# Patient Record
Sex: Male | Born: 1950 | Race: White | Hispanic: No | Marital: Single | Smoking: Current every day smoker
Health system: Southern US, Community
[De-identification: ages and names within clinical notes are randomized; demographics above are authoritative.]

## PROBLEM LIST (undated history)

## (undated) DIAGNOSIS — E785 Hyperlipidemia, unspecified: Secondary | ICD-10-CM

## (undated) DIAGNOSIS — Z5189 Encounter for other specified aftercare: Secondary | ICD-10-CM

## (undated) DIAGNOSIS — M25512 Pain in left shoulder: Principal | ICD-10-CM

## (undated) DIAGNOSIS — G8929 Other chronic pain: Secondary | ICD-10-CM

## (undated) DIAGNOSIS — F419 Anxiety disorder, unspecified: Secondary | ICD-10-CM

## (undated) DIAGNOSIS — I4510 Unspecified right bundle-branch block: Secondary | ICD-10-CM

## (undated) DIAGNOSIS — R0789 Other chest pain: Secondary | ICD-10-CM

## (undated) DIAGNOSIS — I10 Essential (primary) hypertension: Secondary | ICD-10-CM

## (undated) DIAGNOSIS — B1920 Unspecified viral hepatitis C without hepatic coma: Secondary | ICD-10-CM

## (undated) HISTORY — DX: Other chronic pain: G89.29

## (undated) HISTORY — PX: SPLENECTOMY: SUR1306

## (undated) HISTORY — PX: CHOLECYSTECTOMY: SHX55

## (undated) HISTORY — DX: Hyperlipidemia, unspecified: E78.5

## (undated) HISTORY — PX: HERNIA REPAIR: SHX51

## (undated) HISTORY — DX: Pain in left shoulder: M25.512

## (undated) HISTORY — PX: FRACTURE SURGERY: SHX138

## (undated) HISTORY — DX: Unspecified viral hepatitis C without hepatic coma: B19.20

## (undated) HISTORY — DX: Anxiety disorder, unspecified: F41.9

## (undated) HISTORY — DX: Encounter for other specified aftercare: Z51.89

---

## 1999-08-10 ENCOUNTER — Ambulatory Visit (HOSPITAL_BASED_OUTPATIENT_CLINIC_OR_DEPARTMENT_OTHER): Admission: RE | Admit: 1999-08-10 | Discharge: 1999-08-11 | Payer: Self-pay | Admitting: *Deleted

## 2008-07-05 ENCOUNTER — Emergency Department (HOSPITAL_COMMUNITY): Admission: EM | Admit: 2008-07-05 | Discharge: 2008-07-05 | Payer: Self-pay | Admitting: Emergency Medicine

## 2008-12-20 ENCOUNTER — Emergency Department (HOSPITAL_COMMUNITY): Admission: EM | Admit: 2008-12-20 | Discharge: 2008-12-20 | Payer: Self-pay | Admitting: Emergency Medicine

## 2009-09-18 ENCOUNTER — Ambulatory Visit (HOSPITAL_COMMUNITY): Admission: RE | Admit: 2009-09-18 | Discharge: 2009-09-18 | Payer: Self-pay | Admitting: Family Medicine

## 2009-09-25 ENCOUNTER — Encounter (INDEPENDENT_AMBULATORY_CARE_PROVIDER_SITE_OTHER): Payer: Self-pay | Admitting: Surgery

## 2009-09-25 ENCOUNTER — Inpatient Hospital Stay (HOSPITAL_COMMUNITY): Admission: EM | Admit: 2009-09-25 | Discharge: 2009-09-26 | Payer: Self-pay | Admitting: Emergency Medicine

## 2010-11-04 LAB — LIPASE, BLOOD: Lipase: 21 U/L (ref 11–59)

## 2010-11-04 LAB — DIFFERENTIAL
Basophils Relative: 1 % (ref 0–1)
Lymphocytes Relative: 21 % (ref 12–46)
Lymphs Abs: 3.4 10*3/uL (ref 0.7–4.0)
Monocytes Absolute: 1.1 10*3/uL — ABNORMAL HIGH (ref 0.1–1.0)
Monocytes Relative: 7 % (ref 3–12)
Neutro Abs: 11 10*3/uL — ABNORMAL HIGH (ref 1.7–7.7)
Neutrophils Relative %: 68 % (ref 43–77)

## 2010-11-04 LAB — HEPATIC FUNCTION PANEL
ALT: 92 U/L — ABNORMAL HIGH (ref 0–53)
AST: 131 U/L — ABNORMAL HIGH (ref 0–37)
Alkaline Phosphatase: 85 U/L (ref 39–117)
Indirect Bilirubin: 0.5 mg/dL (ref 0.3–0.9)
Total Bilirubin: 0.8 mg/dL (ref 0.3–1.2)

## 2010-11-04 LAB — CBC
MCHC: 34.4 g/dL (ref 30.0–36.0)
RBC: 4.82 MIL/uL (ref 4.22–5.81)

## 2010-11-04 LAB — POCT I-STAT, CHEM 8
HCT: 48 % (ref 39.0–52.0)
Hemoglobin: 16.3 g/dL (ref 13.0–17.0)
Sodium: 138 mEq/L (ref 135–145)

## 2010-11-22 LAB — DIFFERENTIAL
Basophils Absolute: 0.1 K/uL (ref 0.0–0.1)
Basophils Relative: 1 % (ref 0–1)
Eosinophils Absolute: 0.4 10*3/uL (ref 0.0–0.7)
Eosinophils Relative: 2 % (ref 0–5)
Lymphocytes Relative: 22 % (ref 12–46)
Lymphs Abs: 3.8 K/uL (ref 0.7–4.0)
Monocytes Absolute: 1.2 10*3/uL — ABNORMAL HIGH (ref 0.1–1.0)
Monocytes Relative: 7 % (ref 3–12)
Neutro Abs: 12 K/uL — ABNORMAL HIGH (ref 1.7–7.7)
Neutrophils Relative %: 69 % (ref 43–77)

## 2010-11-22 LAB — URINALYSIS, ROUTINE W REFLEX MICROSCOPIC
Bilirubin Urine: NEGATIVE
Glucose, UA: NEGATIVE mg/dL
Hgb urine dipstick: NEGATIVE
Ketones, ur: NEGATIVE mg/dL
Nitrite: NEGATIVE
Protein, ur: NEGATIVE mg/dL
Specific Gravity, Urine: 1.021 (ref 1.005–1.030)
Urobilinogen, UA: 1 mg/dL (ref 0.0–1.0)
pH: 6 (ref 5.0–8.0)

## 2010-11-22 LAB — COMPREHENSIVE METABOLIC PANEL WITH GFR
AST: 84 U/L — ABNORMAL HIGH (ref 0–37)
Alkaline Phosphatase: 97 U/L (ref 39–117)
GFR calc Af Amer: >60 mL/min (ref >60)
GFR calc non Af Amer: >60 mL/min (ref >60)
Potassium: 3.5 meq/L (ref 3.5–5.1)
Sodium: 140 meq/L (ref 135–145)
Total Bilirubin: 0.6 mg/dL (ref 0.3–1.2)
Total Protein: 5.9 g/dL — ABNORMAL LOW (ref 6.0–8.3)

## 2010-11-22 LAB — COMPREHENSIVE METABOLIC PANEL
ALT: 58 U/L — ABNORMAL HIGH (ref 0–53)
Albumin: 3.6 g/dL (ref 3.5–5.2)
BUN: 15 mg/dL (ref 6–23)
CO2: 30 mEq/L (ref 19–32)
Calcium: 9 mg/dL (ref 8.4–10.5)
Chloride: 105 mEq/L (ref 96–112)
Creatinine, Ser: 0.8 mg/dL (ref 0.4–1.5)
Glucose, Bld: 122 mg/dL — ABNORMAL HIGH (ref 70–99)

## 2010-11-22 LAB — CBC
HCT: 43.3 % (ref 39.0–52.0)
Hemoglobin: 15.2 g/dL (ref 13.0–17.0)
MCHC: 35 g/dL (ref 30.0–36.0)
MCV: 98.6 fL (ref 78.0–100.0)
Platelets: 305 K/uL (ref 150–400)
RBC: 4.39 MIL/uL (ref 4.22–5.81)
RDW: 13.7 % (ref 11.5–15.5)
WBC: 17.5 10*3/uL — ABNORMAL HIGH (ref 4.0–10.5)

## 2010-11-22 LAB — ETHANOL: Alcohol, Ethyl (B): <5 mg/dL (ref 0–10)

## 2010-11-22 LAB — LIPASE, BLOOD: Lipase: 25 U/L (ref 11–59)

## 2011-10-19 ENCOUNTER — Ambulatory Visit (INDEPENDENT_AMBULATORY_CARE_PROVIDER_SITE_OTHER): Payer: 59

## 2011-10-21 ENCOUNTER — Ambulatory Visit (INDEPENDENT_AMBULATORY_CARE_PROVIDER_SITE_OTHER): Payer: 59 | Admitting: Family Medicine

## 2011-10-21 VITALS — BP 112/75 | HR 91 | Temp 97.9°F | Resp 16 | Ht 75.0 in | Wt 210.0 lb

## 2011-10-21 DIAGNOSIS — F411 Generalized anxiety disorder: Secondary | ICD-10-CM

## 2011-10-21 DIAGNOSIS — M25519 Pain in unspecified shoulder: Secondary | ICD-10-CM

## 2011-10-21 DIAGNOSIS — G4700 Insomnia, unspecified: Secondary | ICD-10-CM

## 2011-10-21 DIAGNOSIS — F419 Anxiety disorder, unspecified: Secondary | ICD-10-CM

## 2011-10-21 MED ORDER — AMITRIPTYLINE HCL 100 MG PO TABS: 100.0000 mg | ORAL_TABLET | Freq: Every day | ORAL | Status: DC

## 2011-10-21 MED ORDER — HYDROCODONE-IBUPROFEN 5-200 MG PO TABS: 1.0000 | ORAL_TABLET | Freq: Every day | ORAL | Status: AC

## 2011-10-21 NOTE — Progress Notes (Signed)
  Subjective:    Patient ID: Tanner Webb, male    DOB: [birth year], 61 y.o.   MRN: [id number]  HPI 61 yo male with insomnia, anxiety, and chronic shoulder pain.  Takes amitriptyline, xanax, and vicoprofen for these issues.  Last given 6 month supply in September.  Now here for refill. 1) insomnia - amitriptyline 100mg . Helps with sleep and move.    2) anxiety - occ xanax.  Uses maybe only 2 times a month.  Still has refills left on previous Rx.   3) chronic shoulder pain - sometimes keeps him up at night.  Takes occ vicoprofen.  Worse after lifting.  Doesn't take daily.  A couple times a week when work is harder.     Review of Systems Negative except as per HPI     Objective:   Physical Exam  Constitutional: He appears well-developed and well-nourished.  Cardiovascular: Normal rate, regular rhythm, normal heart sounds and intact distal pulses.   No murmur heard. Pulmonary/Chest: Effort normal and breath sounds normal.  Neurological: He is alert.  Skin: Skin is warm and dry.          Assessment & Plan:  Insomnia - refilled amitriptyline.  Anxiety - has xanax refills.  Use until pharmacy says expired or until run out then call. Ok to refill.    Chronic shoulder pain - refilled vicoprofen.

## 2011-11-16 ENCOUNTER — Other Ambulatory Visit: Payer: Self-pay | Admitting: Family Medicine

## 2011-12-05 ENCOUNTER — Other Ambulatory Visit: Payer: Self-pay

## 2011-12-05 MED ORDER — ALPRAZOLAM 0.25 MG PO TABS: 0.2500 mg | ORAL_TABLET | Freq: Every evening | ORAL | Status: DC | PRN

## 2011-12-08 ENCOUNTER — Telehealth: Payer: Self-pay | Admitting: Internal Medicine

## 2011-12-08 MED ORDER — ALPRAZOLAM 0.25 MG PO TABS: 0.2500 mg | ORAL_TABLET | Freq: Every evening | ORAL | Status: DC | PRN

## 2011-12-08 NOTE — Telephone Encounter (Signed)
Refilled Xanax 0.25 mg #30 no refills.

## 2012-03-19 ENCOUNTER — Encounter: Payer: Self-pay | Admitting: Emergency Medicine

## 2012-03-21 ENCOUNTER — Encounter: Payer: Self-pay | Admitting: Emergency Medicine

## 2012-03-30 ENCOUNTER — Ambulatory Visit (INDEPENDENT_AMBULATORY_CARE_PROVIDER_SITE_OTHER): Payer: 59 | Admitting: Internal Medicine

## 2012-03-30 VITALS — BP 110/70 | HR 78 | Temp 98.3°F | Resp 17 | Ht 75.0 in | Wt 195.0 lb

## 2012-03-30 DIAGNOSIS — D649 Anemia, unspecified: Secondary | ICD-10-CM

## 2012-03-30 DIAGNOSIS — R634 Abnormal weight loss: Secondary | ICD-10-CM

## 2012-03-30 DIAGNOSIS — B1920 Unspecified viral hepatitis C without hepatic coma: Secondary | ICD-10-CM

## 2012-03-30 DIAGNOSIS — G8929 Other chronic pain: Secondary | ICD-10-CM

## 2012-03-30 DIAGNOSIS — Z23 Encounter for immunization: Secondary | ICD-10-CM

## 2012-03-30 DIAGNOSIS — M25519 Pain in unspecified shoulder: Secondary | ICD-10-CM

## 2012-03-30 DIAGNOSIS — IMO0001 Reserved for inherently not codable concepts without codable children: Secondary | ICD-10-CM

## 2012-03-30 DIAGNOSIS — R350 Frequency of micturition: Secondary | ICD-10-CM

## 2012-03-30 DIAGNOSIS — E785 Hyperlipidemia, unspecified: Secondary | ICD-10-CM

## 2012-03-30 LAB — LIPID PANEL
Cholesterol: 205 mg/dL — ABNORMAL HIGH (ref 0–200)
LDL Cholesterol: 105 mg/dL — ABNORMAL HIGH (ref 0–99)
Total CHOL/HDL Ratio: 2.8 Ratio
Triglycerides: 135 mg/dL (ref <150)
VLDL: 27 mg/dL (ref 0–40)

## 2012-03-30 LAB — POCT CBC
HCT, POC: 35.4 % — AB (ref 43.5–53.7)
Lymph, poc: 2 (ref 0.6–3.4)
MID (cbc): 0.3 (ref 0–0.9)
MPV: 9.4 fL (ref 0–99.8)
POC LYMPH PERCENT: 55.3 %L — AB (ref 10–50)
POC MID %: 8.1 %M (ref 0–12)
Platelet Count, POC: 160 10*3/uL (ref 142–424)

## 2012-03-30 LAB — COMPREHENSIVE METABOLIC PANEL
AST: 20 U/L (ref 0–37)
Alkaline Phosphatase: 47 U/L (ref 39–117)
BUN: 12 mg/dL (ref 6–23)
CO2: 26 mEq/L (ref 19–32)
Chloride: 103 mEq/L (ref 96–112)
Creat: 0.8 mg/dL (ref 0.50–1.35)
Glucose, Bld: 94 mg/dL (ref 70–99)
Potassium: 4.5 mEq/L (ref 3.5–5.3)

## 2012-03-30 LAB — POCT URINALYSIS DIPSTICK
Glucose, UA: NEGATIVE
Protein, UA: NEGATIVE
Urobilinogen, UA: 0.2

## 2012-03-30 LAB — POCT UA - MICROSCOPIC ONLY
Crystals, Ur, HPF, POC: NEGATIVE
Epithelial cells, urine per micros: NEGATIVE
RBC, urine, microscopic: NEGATIVE
WBC, Ur, HPF, POC: NEGATIVE

## 2012-03-30 MED ORDER — AMITRIPTYLINE HCL 100 MG PO TABS: 100.0000 mg | ORAL_TABLET | Freq: Every day | ORAL | Status: DC

## 2012-03-30 MED ORDER — HYDROCODONE-IBUPROFEN 7.5-200 MG PO TABS: 1.0000 | ORAL_TABLET | Freq: Three times a day (TID) | ORAL | Status: DC | PRN

## 2012-03-30 NOTE — Progress Notes (Signed)
Subjective:    Patient ID: Tanner Webb, male    DOB: 1951-03-28, 61 y.o.   MRN: [id number]  HPI Hx of hepatitis c almost in remission with tx at [facility]. Hx of chronic left shoulder pain, uses vicoprofen prn. Not to use tylenol due to hep c. Hx splenectomy, needs menactra   Review of Systems Schedule  cpe    Objective:   Physical Exam  Constitutional: He is oriented to person, place, and time. He appears well-developed and well-nourished.  HENT:  Right Ear: External ear normal.  Left Ear: External ear normal.  Mouth/Throat: Oropharynx is clear and moist.  Eyes: EOM are normal. Pupils are equal, round, and reactive to light.  Neck: Normal range of motion. Neck supple. No thyromegaly present.  Cardiovascular: Normal rate, regular rhythm and normal heart sounds.   Pulmonary/Chest: Effort normal and breath sounds normal.  Abdominal: Soft. Bowel sounds are normal. He exhibits no mass. There is no tenderness.  Musculoskeletal: He exhibits tenderness.  Lymphadenopathy:    He has no cervical adenopathy.  Neurological: He is alert and oriented to person, place, and time.  Skin: Skin is warm and dry.  Psychiatric: He has a normal mood and affect.   Results for orders placed in visit on 03/30/12  POCT CBC      Component Value Range   WBC 3.6 (*) 4.6 - 10.2 K/uL   Lymph, poc 2.0  0.6 - 3.4   POC LYMPH PERCENT 55.3 (*) 10 - 50 %L   MID (cbc) 0.3  0 - 0.9   POC MID % 8.1  0 - 12 %M   POC Granulocyte 1.3 (*) 2 - 6.9   Granulocyte percent 36.6 (*) 37 - 80 %G   RBC 3.64 (*) 4.69 - 6.13 M/uL   Hemoglobin 10.4 (*) 14.1 - 18.1 g/dL   HCT, POC 01.0 (*) 27.2 - 53.7 %   MCV 97.3 (*) 80 - 97 fL   MCH, POC 28.6  27 - 31.2 pg   MCHC 29.4 (*) 31.8 - 35.4 g/dL   RDW, POC 53.6     Platelet Count, POC 160  142 - 424 K/uL   MPV 9.4  0 - 99.8 fL  POCT URINALYSIS DIPSTICK      Component Value Range   Color, UA yellow     Clarity, UA clear     Glucose, UA neg     Bilirubin, UA neg     Ketones, UA neg     Spec Grav, UA <=1.005     Blood, UA neg     pH, UA 7.0     Protein, UA neg     Urobilinogen, UA 0.2     Nitrite, UA neg     Leukocytes, UA Negative    POCT UA - MICROSCOPIC ONLY      Component Value Range   WBC, Ur, HPF, POC neg     RBC, urine, microscopic neg     Bacteria, U Microscopic neg     Mucus, UA neg     Epithelial cells, urine per micros neg     Crystals, Ur, HPF, POC neg     Casts, Ur, LPF, POC neg     Yeast, UA neg    POCT SEDIMENTATION RATE      Component Value Range   POCT SED RATE    0 - 22 mm/hr   Add hemosure       Assessment & Plan:  New anemia, 2011 h/h  was normal, is on immunosuppresive therapy. Was told tx cause. RF meds 1 yr CPE 9/5 730am 102

## 2012-03-31 LAB — IBC PANEL
TIBC: 397 ug/dL (ref 215–435)
UIBC: 273 ug/dL (ref 125–400)

## 2012-03-31 LAB — IRON: Iron: 124 ug/dL (ref 42–165)

## 2012-04-01 ENCOUNTER — Encounter: Payer: Self-pay | Admitting: Radiology

## 2012-04-18 ENCOUNTER — Ambulatory Visit (INDEPENDENT_AMBULATORY_CARE_PROVIDER_SITE_OTHER): Payer: 59 | Admitting: Internal Medicine

## 2012-04-18 ENCOUNTER — Ambulatory Visit: Payer: 59

## 2012-04-18 VITALS — BP 108/68 | HR 72 | Temp 97.6°F | Resp 16 | Ht 75.0 in | Wt 196.4 lb

## 2012-04-18 DIAGNOSIS — G4700 Insomnia, unspecified: Secondary | ICD-10-CM

## 2012-04-18 DIAGNOSIS — D6489 Other specified anemias: Secondary | ICD-10-CM

## 2012-04-18 DIAGNOSIS — B182 Chronic viral hepatitis C: Secondary | ICD-10-CM

## 2012-04-18 DIAGNOSIS — Z0000 Encounter for general adult medical examination without abnormal findings: Secondary | ICD-10-CM

## 2012-04-18 NOTE — Progress Notes (Signed)
  Subjective:    Patient ID: Tanner Webb, male    DOB: [birth year], 61 y.o.   MRN: [id number]  HPI Hep C txed at [facility], not in remission but improved. 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 Insomnia and chronic pain shoulder controlled. Works hard labor regularly. See scanned hx All labs are good here  Review of Systems See scanned ros    Objective:   Physical Exam  Nursing note and vitals reviewed. Constitutional: He is oriented to person, place, and time. He appears well-developed and well-nourished. No distress.  HENT:  Head: Normocephalic.  Right Ear: External ear normal.  Left Ear: External ear normal.  Nose: Nose normal.  Mouth/Throat: Oropharynx is clear and moist.  Eyes: Conjunctivae and EOM are normal. Pupils are equal, round, and reactive to light.  Neck: Normal range of motion. Neck supple. No thyromegaly present.  Cardiovascular: Normal rate, regular rhythm and normal heart sounds.   Pulmonary/Chest: Effort normal and breath sounds normal.  Abdominal: Soft. Bowel sounds are normal.  Genitourinary: Rectum normal, prostate normal and penis normal.  Musculoskeletal: Normal range of motion. He exhibits tenderness.  Lymphadenopathy:    He has no cervical adenopathy.  Neurological: He is alert and oriented to person, place, and time. He has normal reflexes. No cranial nerve deficit. He exhibits normal muscle tone. Coordination normal.  Skin: Skin is warm and dry.  Psychiatric: He has a normal mood and affect. His behavior is normal. Judgment and thought content normal.     [facility] reading (PRIMARY) by  Dr.Amos Micheals cxr.nad  EKG ok     Assessment & Plan:  Refer for colonoscopy his first RF all meds 1 yr

## 2012-04-18 NOTE — Patient Instructions (Addendum)
Colorectal Cancer The colon is the large bowel and is the storage part of the bowel for undigested food. The large bowel is also called the intestine or gut. Cancer is a growth that is not supposed to be there.  RISK FACTORS Risks for cancer of the colon include:   Age. More than 90 percent of people with this disease are diagnosed after age 59.   Polyps. Growths on the inner wall of the colon or rectum may become cancerous.   Family history. Some cancers of the colon are inherited or run in families. These include:   Hereditary nonpolyposis colon cancer (HNPCC) is the most common type of inherited (genetic) colorectal cancer. It accounts for about 2 percent of all colorectal cancer cases. It is caused by genetic changes. About 75% of people with an altered HNPCC gene develop colon cancer, and the average age at diagnosis of colon cancer is 44.   Familial adenomatous polyposis (FAP) is a rare inherited condition in which hundreds of polyps form in the colon and rectum. It is caused by a change in a specific gene called APC. Unless FAP is treated, it usually leads to colorectal cancer by age 40. FAP accounts for less than 1 percent of all colorectal cancer cases.   Family members of people who have HNPCC or FAP can have genetic testing to check for specific genetic changes. For those who have changes in their genes, health care providers may suggest ways to try to reduce the risk of colorectal cancer or to improve the detection of this disease. For adults with FAP, the doctor may recommend an operation to remove all or part of the colon and rectum.   Personal history of colorectal cancer. A person who has already had colorectal cancer may develop colorectal cancer a second time. Also, women with a history of cancer of the ovary, uterus (endometrium), or breast are at a somewhat higher risk of developing colorectal cancer.   Ulcerative colitis or Crohn's disease. A person who has had a condition  that causes inflammation of the colon (such as ulcerative colitis or Crohn's disease) for many years is at increased risk of developing colorectal cancer.   Diet. Studies suggest that diets high in fat (especially animal fat) and low in calcium, folate, and fiber may increase the risk of colorectal cancer. Also, some studies suggest that people who eat a diet very low in fruits and vegetables may have a higher risk of colorectal cancer. More research is needed to better understand how diet affects the risk of colorectal cancer.   Cigarette smoking. A person who smokes cigarettes may be at increased risk of developing polyps and colorectal cancer.  SYMPTOMS  Changes in bowel habits.   Diarrhea, constipation, or feeling that the bowel does not empty completely.   Blood (either bright red or very dark) in the stool.   Stools that are narrower than usual.   General discomfort in your belly (abdomen): frequent gas pains, bloating, fullness, and/or cramps.   Weight loss with no known reason.   Constant tiredness.   Nausea and vomiting.  Other health problems can cause the same symptoms, and often these symptoms are not due to cancer. Anyone with these symptoms should see a doctor so that any problem can be diagnosed and treated as early as possible. Usually, early cancer does not cause pain. It is important not to wait to feel pain before seeing a doctor. DIAGNOSIS  If you have any signs or symptoms of   colorectal cancer, the doctor must determine whether they are due to cancer or some other cause. The doctor will ask about personal and family medical history and may do a physical exam. You may have one or more tests to screen for cancer. If the physical exam and test results do not suggest cancer, the doctor may decide that no further tests are needed and no treatment is necessary. Your caregiver may recommend a schedule for checkups. If X-rays show an abnormal area (such as a polyp), a piece of  tissue is taken (biopsy) to check for cancer cells. Often, the abnormal tissue can be removed during a test that examines the colon. These tests include:  Sigmoidoscopy. With this test, the caregiver can see inside your large intestine. A thin flexible tube is placed into your rectum. The device is called a sigmoidoscope. This device has a light and a tiny video camera in it. The caregiver uses the sigmoidoscope to look at the last third of your large intestine.   Colonoscopy. This test is like sigmoidoscopy, but the caregiver looks at all of the large intestine. It usually requires sedation.  If a biopsy was taken, a specialist in examining tissues (pathologist) checks the tissue for cancer cells using a microscope.  If the biopsy shows that cancer is present, the doctor needs to know the extent (stage) of the disease to plan the best treatment. The stage is based on whether the tumor has invaded nearby tissues, whether the cancer has spread, and if so, to what parts of the body. Staging may involve some of the following tests and procedures:  Blood tests. The doctor checks for carcinoembryonic antigen (CEA) and other substances in the blood. Some people who have colorectal cancer have a high CEA level.   Sigmoidoscopy.   Colonoscopy.   Endorectal ultrasound. An ultrasound probe is inserted into the rectum. The probe sends out sound waves that people cannot hear. The waves bounce off the rectum and nearby tissues, and a computer uses the echoes to create a picture. The picture shows how deep a rectal tumor has grown or whether the cancer has spread to lymph nodes or other nearby tissues.   Chest X-ray. X-rays of the chest can show whether cancer has spread to the lungs.   CT scan. This is a X-ray machine linked to a computer takes pictures of areas inside the body. The patient may receive an injection of dye. Tumors in the liver, lungs, or elsewhere in the body show up on the CT scan.  The doctor  also may use other tests (such as MRI) to see whether the cancer has spread. Sometimes staging is not complete until the patient has surgery to remove the tumor. (Surgery for colorectal cancer is described in the "Treatment" section.) DOCTORS DESCRIBE COLORECTAL CANCER BY THE FOLLOWING STAGES:   Stage 0. The cancer is found only in the innermost lining of the colon or rectum. Another name for Stage 0 colorectal cancer is Carcinoma in situ.   Stage I. The cancer has grown into the inner wall of the colon or rectum. The tumor has not reached the outer wall of the colon or extended outside the colon. Another name for Stage I colorectal cancer is Dukes' A.   Stage II. The tumor extends more deeply into or through the wall of the colon or rectum. It may have invaded nearby tissue, but cancer cells have not spread to the lymph nodes. Another name for Stage II colorectal cancer is   Dukes' B.   Stage III. The cancer has spread to nearby lymph nodes but not to other parts of the body. Another name for Stage III colorectal cancer is Dukes' C.   Stage IV. The cancer has spread to other parts of the body, such as the liver or lungs. Another name for Stage IV colorectal cancer is Dukes' D.   Recurrent cancer. This is cancer that has been treated and has returned after a period of time when the cancer could not be detected. The disease may return in the colon, rectum or in another part of the body.  Once you know the diagnosis and the stage of cancer of the colon, your caregiver can advise you and help you decide what the best course of treatment will be.  Document Released: 07/31/2005 Document Revised: 07/20/2011 Document Reviewed: 07/18/2011 [employer] Patient Information 2012 [employer]. 

## 2012-05-09 ENCOUNTER — Ambulatory Visit (INDEPENDENT_AMBULATORY_CARE_PROVIDER_SITE_OTHER): Payer: 59 | Admitting: Family Medicine

## 2012-05-09 ENCOUNTER — Ambulatory Visit: Payer: 59

## 2012-05-09 VITALS — BP 125/81 | HR 84 | Temp 98.3°F | Resp 18 | Ht 74.5 in | Wt 199.0 lb

## 2012-05-09 DIAGNOSIS — T148XXA Other injury of unspecified body region, initial encounter: Secondary | ICD-10-CM

## 2012-05-09 DIAGNOSIS — R0781 Pleurodynia: Secondary | ICD-10-CM

## 2012-05-09 DIAGNOSIS — R079 Chest pain, unspecified: Secondary | ICD-10-CM

## 2012-05-09 DIAGNOSIS — R071 Chest pain on breathing: Secondary | ICD-10-CM
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
## 2012-05-15 DIAGNOSIS — Z23 Encounter for immunization: Secondary | ICD-10-CM

## 2012-05-23 ENCOUNTER — Telehealth: Payer: Self-pay

## 2012-05-23 ENCOUNTER — Ambulatory Visit: Payer: 59

## 2012-05-23 NOTE — Telephone Encounter (Signed)
No fx no pneumothorax, called patient to advise. He did better for a while but now pain has returned and he is getting worse. I have advised for him to come back in, to recheck, he is coming in today.

## 2012-05-23 NOTE — Telephone Encounter (Signed)
Pt states he was told we would send rib xrays to be read and call him,it has been two weeks and he hasn't received call   Best phone [phone]

## 2012-09-28 ENCOUNTER — Other Ambulatory Visit: Payer: Self-pay | Admitting: Internal Medicine

## 2012-10-01 ENCOUNTER — Ambulatory Visit (INDEPENDENT_AMBULATORY_CARE_PROVIDER_SITE_OTHER): Payer: 59 | Admitting: Emergency Medicine

## 2012-10-01 VITALS — BP 133/85 | HR 80 | Temp 97.6°F | Resp 16 | Ht 75.0 in | Wt 209.0 lb

## 2012-10-01 DIAGNOSIS — T1500XA Foreign body in cornea, unspecified eye, initial encounter: Secondary | ICD-10-CM

## 2012-10-01 DIAGNOSIS — G8929 Other chronic pain: Secondary | ICD-10-CM

## 2012-10-01 DIAGNOSIS — M25519 Pain in unspecified shoulder: Secondary | ICD-10-CM

## 2012-10-01 MED ORDER — AMITRIPTYLINE HCL 100 MG PO TABS: 100.0000 mg | ORAL_TABLET | Freq: Every day | ORAL | Status: DC

## 2012-10-01 NOTE — Progress Notes (Signed)
[facility] [address] [place] [address] [phone]  Date:  10/01/2012   Name:  Tanner Webb   DOB:  [birth year]   MRN:  [id number]  PCP:  Tally Due, MD    Chief Complaint: Eye Pain   History of Present Illness:  Tanner Webb is a 62 y.o. very pleasant male patient who presents with the following:  Was cleaning the convertible top of a car on Sunday and splashed a cleaner in his eye.  Has persistent pain in the right eye.  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  Persistent foreign body sensation.  No other complaints. Requested refills on amitriptyline and vicoprofen for his rotator cuff injuryt Patient Active Problem List  Diagnosis  . Chronic pain in left shoulder  . Anemia    Past Medical History  Diagnosis Date  . Chronic pain in left shoulder   . Hyperlipidemia     Past Surgical History  Procedure Laterality Date  . Cholecystectomy      History  Substance Use Topics  . Smoking status: Former Smoker    Quit date: 02/28/2012  . Smokeless tobacco: Not on file  . Alcohol Use: Not on file    History reviewed. No pertinent family history.  Allergies  Allergen Reactions  . Penicillins   . Sulfa Antibiotics     Medication list has been reviewed and updated.  Current Outpatient Prescriptions on File Prior to Visit  Medication Sig Dispense Refill  . amitriptyline (ELAVIL) 100 MG tablet Take 1 tablet (100 mg total) by mouth at bedtime.  30 tablet  5  . HYDROcodone-ibuprofen (VICOPROFEN) 7.5-200 MG per tablet Take 1 tablet by mouth every 8 (eight) hours as needed.  90 tablet  1  . ALPRAZolam (XANAX) 0.25 MG tablet Take 1 tablet (0.25 mg total) by mouth at bedtime as needed.  30 tablet  0   No current facility-administered medications on file prior to visit.    Review of Systems:  As per HPI, otherwise negative.    Physical Examination: Filed Vitals:   10/01/12 0850  BP: 133/85  Pulse: 80  Temp:  97.6 F (36.4 C)  Resp: 16   Filed Vitals:   10/01/12 0850  Height: 6\' 3"  (1.905 m)  Weight: 209 lb (94.802 kg)   Body mass index is 26.12 kg/(m^2). Ideal Body Weight: Weight in (lb) to have BMI = 25: 199.6   GEN: WDWN, NAD, Non-toxic, Alert & Oriented x 3 HEENT: Atraumatic, Normocephalic.   Possible embedded foreign body right cornea.  Has a metallic glint.  Marked injection. Ears and Nose: No external deformity. EXTR: No clubbing/cyanosis/edema NEURO: Normal gait.  PSYCH: Normally interactive. Conversant. Not depressed or anxious appearing.  Calm demeanor.    Assessment and Plan: Embedded foreign body eye Ofloxacin drops Follow up with ophthalmologist today.  Has his own.  Carmelina Dane, MD

## 2012-10-01 NOTE — Patient Instructions (Addendum)
[facility] Policy for Prescribing Controlled Substances (Revised 06/2012) 1. Prescriptions for controlled substances will be filled by ONE provider at [facility] with whom you have established and developed a plan for your care, including follow-up. 2. You are encouraged to schedule an appointment with your prescriber at our appointment center for follow-up visits whenever possible. 3. If you request a prescription for the controlled substance while at [facility] for an acute problem (with someone other than your regular prescriber), you MAY be given a ONE-TIME prescription for a 30-day supply of the controlled substance, to allow time for you to return to see your regular prescriber for additional prescriptions. 4.  

## 2012-10-02 ENCOUNTER — Other Ambulatory Visit: Payer: Self-pay | Admitting: Internal Medicine

## 2012-10-03 NOTE — Telephone Encounter (Signed)
Needs to RTC to discuss need of RFs on HC.

## 2012-10-06 ENCOUNTER — Ambulatory Visit (INDEPENDENT_AMBULATORY_CARE_PROVIDER_SITE_OTHER): Payer: 59 | Admitting: Internal Medicine

## 2012-10-06 VITALS — BP 130/80 | HR 76 | Temp 98.0°F | Resp 17 | Ht 75.0 in | Wt 208.0 lb

## 2012-10-06 DIAGNOSIS — R634 Abnormal weight loss: Secondary | ICD-10-CM

## 2012-10-06 DIAGNOSIS — B1920 Unspecified viral hepatitis C without hepatic coma: Secondary | ICD-10-CM

## 2012-10-06 DIAGNOSIS — M25519 Pain in unspecified shoulder: Secondary | ICD-10-CM

## 2012-10-06 DIAGNOSIS — IMO0001 Reserved for inherently not codable concepts without codable children: Secondary | ICD-10-CM

## 2012-10-06 DIAGNOSIS — E785 Hyperlipidemia, unspecified: Secondary | ICD-10-CM

## 2012-10-06 DIAGNOSIS — Z818 Family history of other mental and behavioral disorders: Secondary | ICD-10-CM

## 2012-10-06 DIAGNOSIS — D649 Anemia, unspecified: Secondary | ICD-10-CM

## 2012-10-06 DIAGNOSIS — G4700 Insomnia, unspecified: Secondary | ICD-10-CM

## 2012-10-06 DIAGNOSIS — G8929 Other chronic pain: Secondary | ICD-10-CM

## 2012-10-06 MED ORDER — HYDROCODONE-IBUPROFEN 7.5-200 MG PO TABS: 1.0000 | ORAL_TABLET | Freq: Three times a day (TID) | ORAL | Status: DC | PRN

## 2012-10-06 MED ORDER — ALPRAZOLAM 0.25 MG PO TABS: 0.2500 mg | ORAL_TABLET | Freq: Every evening | ORAL | Status: DC | PRN

## 2012-10-06 NOTE — Patient Instructions (Addendum)
Alzheimer's Disease Alzheimer's disease is a breaking down of the brain that sometimes happens with aging. It often affects memory, thinking, and speaking. It also affects how you get along with people and job performance. Often the changes come on slowly and are not very noticeable. The changes may be silent and hidden for a long time, even from family and friends. Mood and personality changes often cause the family to seek medical care. CAUSES  Alzheimer's disease is the leading cause of dementia. Dementia is a reduced ability of the working of the brain. Alzheimer's is one type of dementia and is caused by small changes in the brain. There are many causes of Alzheimer's disease. DIAGNOSIS  There are no specific tests for Alzheimer's disease, other than doing a biopsy(tissue sample) of the brain. This is not usually done. Physical examination and history often provide the best clues for a diagnosis. Often the diagnosis may be made over time, with more observation.  TREATMENT  There is no specific treatment for Alzheimer's disease. Your caregivers will discuss the particular problems you are dealing with or may deal with. They can help direct you to other caregivers who can help in the care of Alzheimer's patients.  Document Released: 05/10/2005 Document Revised: 10/23/2011 Document Reviewed: 12/31/2006 [facility] Patient Information 2013 [place], [place]. Alzheimer's Disease Caregiver Guide Alzheimer's disease is an illness that affects a person's brain. It causes a person to lose the ability to remember things and make good decisions. As the disease progresses, the person is unable to take care of himself or herself and needs more and more help to do simple tasks. Taking care of someone with Alzheimer's disease can be very challenging and overwhelming.  MEMORY LOSS AND CONFUSION Memory loss and confusion is mild in the beginning stages of the disease. Both of these problems become more severe as the  disease progresses. Eventually, the person will not recognize places or even close family members and friends.   Stay calm.  Respond with a short explanation. Long explanations can be overwhelming and confusing.  Avoid corrections that sound like scolding.  Try not to take it personally, even if the person forgets your name. BEHAVIOR CHANGES Behavior changes are part of the disease. The person may develop depression, anxiety, anger, hallucinations, or other behavior changes. These changes can come on suddenly and may be in response to pain, infection, changes in the environment (temperature, noise), overstimulation, or feeling lost or scared.   Try not to take behavior changes personally.  Remain calm and patient.  Do not argue or try to convince the person about a specific point. This will only make him or her more agitated.  Know that the behavior changes are part of the disease process and try to work through it. TIPS TO REDUCE FRUSTRATION  Schedule wisely by making appointments and doing daily tasks, like bathing and dressing, when the person is at his or her best.  Take your time. Simple tasks may take a lot longer, so be sure to allow for plenty of time.  Limit choices. Too many choices can be overwhelming and stressful for the person.  Involve the person in what you are doing.  Stick to a routine.  Avoid new or crowded situations, if possible.  Use simple words, short sentences, and a calm voice. Only give 1 direction at a time.  Buy clothes and shoes that are easy to put on and take off.  Let people help if they offer. HOME SAFETY Keeping the home  safe is very important to reduce the risk of falls and injuries.   Keep floors clear of clutter. Remove rugs, magazine racks, and floor lamps.  Keep hallways well lit.  Put a handrail and nonslip mat in the bathtub or shower.  Put childproof locks on cabinets with dangerous items, such as medicine, alcohol, guns, toxic  cleaning items, sharp tools or utensils, matches, or lighters.  Place locks on doors where the person cannot easily see or reach them. This helps ensure that the person cannot wander out of the house and get lost.  Be prepared for emergencies. Keep a list of emergency phone numbers and addresses in a convenient area. PLANS FOR THE FUTURE  Do not put off talking about finances.  Talk about money management. People with Alzheimer's disease have trouble managing their money as the disease gets worse.  Get help from professional advisors regarding financial and legal matters.  Do not put off talking about future care.  Choose a power of attorney. This is someone who can make decisions for the person with Alzheimer's disease when he or she is no longer able to do so.  Talk about driving and when it is the right time to stop. The person's doctor can help give advice on this matter.  Talk about the person's living situation. If he or she lives alone, you need to make sure he or she is safe. Some people need extra help at home, and others need more care at a nursing home or care center. SUPPORT GROUPS Joining a support group can be very helpful for caregivers of people with Alzheimer's disease. Some advantages to being part of a support group include:   Getting strategies to manage stress.  Sharing experiences with others.  Receiving emotional comfort and support.  Learning new caregiving skills as the disease progresses.  Knowing what community resources are available and taking advantage of them. SEEK MEDICAL CARE IF:  The person has a fever.  The person has a sudden change in behavior that does not improve with calming strategies.  The person is unable to manage in his or her current living situation.  The person threatens you or anyone else, including himself or herself.  You are no longer able to care for the person. Document Released: 04/11/2004 Document Revised: 01/30/2012  Document Reviewed: 09/06/2011 [facility] Patient Information 2013 [place], [place].

## 2012-10-06 NOTE — Progress Notes (Signed)
  Subjective:    Patient ID: Tanner Webb, male    DOB: June 19, 1951, 62 y.o.   MRN: [id number]  HPI Requests rfs vicoprofen and alprazolam, med hx researched and not using meds daily only prn. Has shoulfder pain, mother with alzheimers and he is the care giver. Works hard in Data processing manager, reviewed again shoulder stretches and exercises. See problem list. Chronic joint/shoulder pain Insomnia All recent labs reviewed.  Review of Systems Hep c active beginning to worsen again/sees [facility] hepatology Anemia    Objective:   Physical Exam Appears well Lungs clear/Heart normal/No icterus Shoulder both painful with mild decrease rom but good strength and no nmsv loss.       Assessment & Plan:  Shoulder exercises Counseled on alzheimers/mom Continue and start new TX for Hep C RF alprazolam/Vicoprofen and to use sparingly

## 2012-10-09 NOTE — Progress Notes (Signed)
Reviewed and agree.

## 2013-06-06 ENCOUNTER — Ambulatory Visit: Payer: 59

## 2013-06-06 ENCOUNTER — Ambulatory Visit (INDEPENDENT_AMBULATORY_CARE_PROVIDER_SITE_OTHER): Payer: 59 | Admitting: Internal Medicine

## 2013-06-06 VITALS — BP 128/82 | HR 79 | Temp 98.4°F | Resp 17 | Ht 74.5 in | Wt 217.0 lb

## 2013-06-06 DIAGNOSIS — G8929 Other chronic pain: Secondary | ICD-10-CM

## 2013-06-06 DIAGNOSIS — Z818 Family history of other mental and behavioral disorders: Secondary | ICD-10-CM

## 2013-06-06 DIAGNOSIS — B1920 Unspecified viral hepatitis C without hepatic coma: Secondary | ICD-10-CM

## 2013-06-06 DIAGNOSIS — G4700 Insomnia, unspecified: Secondary | ICD-10-CM

## 2013-06-06 DIAGNOSIS — M25512 Pain in left shoulder: Secondary | ICD-10-CM

## 2013-06-06 DIAGNOSIS — M25519 Pain in unspecified shoulder: Secondary | ICD-10-CM

## 2013-06-06 DIAGNOSIS — D649 Anemia, unspecified: Secondary | ICD-10-CM

## 2013-06-06 LAB — POCT CBC
Hemoglobin: 15.9 g/dL (ref 14.1–18.1)
MCH, POC: 32.9 pg — AB (ref 27–31.2)
MCV: 100.2 fL — AB (ref 80–97)
MPV: 8.7 fL (ref 0–99.8)
POC MID %: 7.4 %M (ref 0–12)
RBC: 4.83 M/uL (ref 4.69–6.13)
WBC: 8.5 10*3/uL (ref 4.6–10.2)

## 2013-06-06 MED ORDER — ALPRAZOLAM 0.25 MG PO TABS: 0.2500 mg | ORAL_TABLET | Freq: Every evening | ORAL | Status: DC | PRN

## 2013-06-06 MED ORDER — AMITRIPTYLINE HCL 100 MG PO TABS: 100.0000 mg | ORAL_TABLET | Freq: Every day | ORAL | Status: DC

## 2013-06-06 MED ORDER — HYDROCODONE-IBUPROFEN 7.5-200 MG PO TABS: 1.0000 | ORAL_TABLET | Freq: Three times a day (TID) | ORAL | Status: DC | PRN

## 2013-06-06 NOTE — Patient Instructions (Signed)
Rotator Cuff Injury The rotator cuff is the collective set of muscles and tendons that make up the stabilizing unit of your shoulder. This unit holds in the ball of the humerus (upper arm bone) in the socket of the scapula (shoulder blade). Injuries to this stabilizing unit most commonly come from sports or activities that cause the arm to be moved repeatedly over the head. Examples of this include throwing, weight lifting, swimming, racquet sports, or an injury such as falling on your arm. Chronic (longstanding) irritation of this unit can cause inflammation (soreness), bursitis, and eventual damage to the tendons to the point of rupture (tear). An acute (sudden) injury of the rotator cuff can result in a partial or complete tear. You may need surgery with complete tears. Small or partial rotator cuff tears may be treated conservatively with temporary immobilization, exercises and rest. Physical therapy may be needed. HOME CARE INSTRUCTIONS   Apply ice to the injury for 15-20 minutes 3-4 times per day for the first 2 days. Put the ice in a plastic bag and place a towel between the bag of ice and your skin.  If you have a shoulder immobilizer (sling and straps), do not remove it for as long as directed by your caregiver or until you see a caregiver for a follow-up examination. If you need to remove it, move your arm as little as possible.  You may want to sleep on several pillows or in a recliner at night to lessen swelling and pain.  Only take over-the-counter or prescription medicines for pain, discomfort, or fever as directed by your caregiver. Do simple hand squeezing exercises with a soft rubber ball to decrease hand swelling.Shoulder Exercises EXERCISES  RANGE OF MOTION (ROM) AND STRETCHING EXERCISES These exercises may help you when beginning to rehabilitate your injury. Your symptoms may resolve with or without further involvement from your physician, physical therapist or athletic trainer.  While completing these exercises, remember:   Restoring tissue flexibility helps normal motion to return to the joints. This allows healthier, less painful movement and activity.  An effective stretch should be held for at least 30 seconds.  A stretch should never be painful. You should only feel a gentle lengthening or release in the stretched tissue. ROM - Pendulum  Bend at the waist so that your right / left arm falls away from your body. Support yourself with your opposite hand on a solid surface, such as a table or a countertop.  Your right / left arm should be perpendicular to the ground. If it is not perpendicular, you need to lean over farther. Relax the muscles in your right / left arm and shoulder as much as possible.  Gently sway your hips and trunk so they move your right / left arm without any use of your right / left shoulder muscles.  Progress your movements so that your right / left arm moves side to side, then forward and backward, and finally, both clockwise and counterclockwise.  Complete __________ repetitions in each direction. Many people use this exercise to relieve discomfort in their shoulder as well as to gain range of motion. Repeat __________ times. Complete this exercise __________ times per day. STRETCH  Flexion, Standing  Stand with good posture. With an underhand grip on your right / left hand and an overhand grip on the opposite hand, grasp a broomstick or cane so that your hands are a little more than shoulder-width apart.  Keeping your right / left elbow straight and shoulder  muscles relaxed, push the stick with your opposite hand to raise your right / left arm in front of your body and then overhead. Raise your arm until you feel a stretch in your right / left shoulder, but before you have increased shoulder pain.  Try to avoid shrugging your right / left shoulder as your arm rises by keeping your shoulder blade tucked down and toward your mid-back spine.  Hold __________ seconds.  Slowly return to the starting position. Repeat __________ times. Complete this exercise __________ times per day. STRETCH - Internal Rotation  Place your right / left hand behind your back, palm-up.  Throw a towel or belt over your opposite shoulder. Grasp the towel/belt with your right / left hand.  While keeping an upright posture, gently pull up on the towel/belt until you feel a stretch in the front of your right / left shoulder.  Avoid shrugging your right / left shoulder as your arm rises by keeping your shoulder blade tucked down and toward your mid-back spine.  Hold __________. Release the stretch by lowering your opposite hand. Repeat __________ times. Complete this exercise __________ times per day. STRETCH - External Rotation and Abduction  Stagger your stance through a doorframe. It does not matter which foot is forward.  As instructed by your physician, physical therapist or athletic trainer, place your hands:  And forearms above your head and on the door frame.  And forearms at head-height and on the door frame.  At elbow-height and on the door frame.  Keeping your head and chest upright and your stomach muscles tight to prevent over-extending your low-back, slowly shift your weight onto your front foot until you feel a stretch across your chest and/or in the front of your shoulders.  Hold __________ seconds. Shift your weight to your back foot to release the stretch. Repeat __________ times. Complete this stretch __________ times per day.  STRENGTHENING EXERCISES  These exercises may help you when beginning to rehabilitate your injury. They may resolve your symptoms with or without further involvement from your physician, physical therapist or athletic trainer. While completing these exercises, remember:   Muscles can gain both the endurance and the strength needed for everyday activities through controlled exercises.  Complete these  exercises as instructed by your physician, physical therapist or athletic trainer. Progress the resistance and repetitions only as guided.  You may experience muscle soreness or fatigue, but the pain or discomfort you are trying to eliminate should never worsen during these exercises. If this pain does worsen, stop and make certain you are following the directions exactly. If the pain is still present after adjustments, discontinue the exercise until you can discuss the trouble with your clinician.  If advised by your physician, during your recovery, avoid activity or exercises which involve actions that place your right / left hand or elbow above your head or behind your back or head. These positions stress the tissues which are trying to heal. STRENGTH - Scapular Depression and Adduction  With good posture, sit on a firm chair. Supported your arms in front of you with pillows, arm rests or a table top. Have your elbows in line with the sides of your body.  Gently draw your shoulder blades down and toward your mid-back spine. Gradually increase the tension without tensing the muscles along the top of your shoulders and the back of your neck.  Hold for __________ seconds. Slowly release the tension and relax your muscles completely before completing the next repetition.  After you have practiced this exercise, remove the arm support and complete it in standing as well as sitting. Repeat __________ times. Complete this exercise __________ times per day.  STRENGTH - External Rotators  Secure a rubber exercise band/tubing to a fixed object so that it is at the same height as your right / left elbow when you are standing or sitting on a firm surface.  Stand or sit so that the secured exercise band/tubing is at your side that is not injured.  Bend your elbow 90 degrees. Place a folded towel or small pillow under your right / left arm so that your elbow is a few inches away from your side.  Keeping  the tension on the exercise band/tubing, pull it away from your body, as if pivoting on your elbow. Be sure to keep your body steady so that the movement is only coming from your shoulder rotating.  Hold __________ seconds. Release the tension in a controlled manner as you return to the starting position. Repeat __________ times. Complete this exercise __________ times per day.  STRENGTH - Supraspinatus  Stand or sit with good posture. Grasp a __________ weight or an exercise band/tubing so that your hand is "thumbs-up," like when you shake hands.  Slowly lift your right / left hand from your thigh into the air, traveling about 30 degrees from straight out at your side. Lift your hand to shoulder height or as far as you can without increasing any shoulder pain. Initially, many people do not lift their hands above shoulder height.  Avoid shrugging your right / left shoulder as your arm rises by keeping your shoulder blade tucked down and toward your mid-back spine.  Hold for __________ seconds. Control the descent of your hand as you slowly return to your starting position. Repeat __________ times. Complete this exercise __________ times per day.  STRENGTH - Shoulder Extensors  Secure a rubber exercise band/tubing so that it is at the height of your shoulders when you are either standing or sitting on a firm arm-less chair.  With a thumbs-up grip, grasp an end of the band/tubing in each hand. Straighten your elbows and lift your hands straight in front of you at shoulder height. Step back away from the secured end of band/tubing until it becomes tense.  Squeezing your shoulder blades together, pull your hands down to the sides of your thighs. Do not allow your hands to go behind you.  Hold for __________ seconds. Slowly ease the tension on the band/tubing as you reverse the directions and return to the starting position. Repeat __________ times. Complete this exercise __________ times per day.    STRENGTH - Scapular Retractors  Secure a rubber exercise band/tubing so that it is at the height of your shoulders when you are either standing or sitting on a firm arm-less chair.  With a palm-down grip, grasp an end of the band/tubing in each hand. Straighten your elbows and lift your hands straight in front of you at shoulder height. Step back away from the secured end of band/tubing until it becomes tense.  Squeezing your shoulder blades together, draw your elbows back as you bend them. Keep your upper arm lifted away from your body throughout the exercise.  Hold __________ seconds. Slowly ease the tension on the band/tubing as you reverse the directions and return to the starting position. Repeat __________ times. Complete this exercise __________ times per day. STRENGTH  Scapular Depressors  Find a sturdy chair without wheels, such as  a from a dining room table.  Keeping your feet on the floor, lift your bottom from the seat and lock your elbows.  Keeping your elbows straight, allow gravity to pull your body weight down. Your shoulders will rise toward your ears.  Raise your body against gravity by drawing your shoulder blades down your back, shortening the distance between your shoulders and ears. Although your feet should always maintain contact with the floor, your feet should progressively support less body weight as you get stronger.  Hold __________ seconds. In a controlled and slow manner, lower your body weight to begin the next repetition. Repeat __________ times. Complete this exercise __________ times per day.  Document Released: 06/14/2005 Document Revised: 10/23/2011 Document Reviewed: 11/12/2008 [facility] Patient Information 2014 [place], [place].   SEEK MEDICAL CARE IF:   Pain in your shoulder increases or new pain or numbness develops in your arm, hand, or fingers.  Your hand or fingers are colder than your other hand. SEEK IMMEDIATE MEDICAL CARE IF:   Your arm,  hand, or fingers are numb or tingling.  Your arm, hand, or fingers are increasingly swollen and painful, or turn white or blue. Document Released: 07/28/2000 Document Revised: 10/23/2011 Document Reviewed: 07/21/2008 [facility] Patient Information 2014 [place], [place].

## 2013-06-06 NOTE — Progress Notes (Signed)
  Subjective:    Patient ID: Tanner Webb, male    DOB: July 06, 1951, 62 y.o.   MRN: [id number]  HPI Patient here today for left shoulder pain. 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 Pain has been coming and going for the past 3 weeks. Has taken Motrin OTC but has not had any relief.  Patient is also here for medication refills on xanax, amitriptyline and hydrocodone. Has active hepatitis C, scheduled for new tx with [facility] and all lab testing. On chart review, last cbc he was anemic, will repeat    Review of Systems     Objective:   Physical Exam  Constitutional: He is oriented to person, place, and time. He appears well-developed and well-nourished. No distress.  HENT:  Head: Normocephalic.  Eyes: EOM are normal. No scleral icterus.  Neck: Normal range of motion.  Pulmonary/Chest: Effort normal.  Musculoskeletal: He exhibits tenderness.       Left shoulder: He exhibits tenderness, pain and spasm. He exhibits normal range of motion, no bony tenderness, no swelling, no effusion, no crepitus, no deformity, no laceration, normal pulse and normal strength.  Neurological: He is alert and oriented to person, place, and time. He exhibits normal muscle tone. Coordination normal.  Skin: No rash noted.  Psychiatric: He has a normal mood and affect. His behavior is normal.   [facility] reading (PRIMARY) by  Dr Perrin Maltese normal shoulder xr Results for orders placed in visit on 06/06/13  POCT CBC      Result Value Range   WBC 8.5  4.6 - 10.2 K/uL   Lymph, poc 2.4  0.6 - 3.4   POC LYMPH PERCENT 28.3  10 - 50 %L   MID (cbc) 0.6  0 - 0.9   POC MID % 7.4  0 - 12 %M   POC Granulocyte 5.5  2 - 6.9   Granulocyte percent 64.3  37 - 80 %G   RBC 4.83  4.69 - 6.13 M/uL   Hemoglobin 15.9  14.1 - 18.1 g/dL   HCT, POC 40.9  81.1 - 53.7 %   MCV 100.2 (*) 80 - 97 fL   MCH, POC 32.9 (*) 27 - 31.2 pg   MCHC 32.9  31.8 - 35.4 g/dL   RDW, POC 91.4     Platelet  Count, POC 399  142 - 424 K/uL   MPV 8.7  0 - 99.8 fL     Sterile field and prep left shoulder Injectio depomedrol/Lidocain dressing.         Assessment & Plan:  RICE shoulder When improved ROM and strengthen Anemia resolved

## 2013-06-09 ENCOUNTER — Ambulatory Visit: Payer: 59

## 2013-08-18 ENCOUNTER — Ambulatory Visit (INDEPENDENT_AMBULATORY_CARE_PROVIDER_SITE_OTHER): Payer: 59 | Admitting: Internal Medicine

## 2013-08-18 VITALS — BP 130/72 | HR 70 | Temp 97.6°F | Resp 16 | Ht 75.5 in | Wt 223.6 lb

## 2013-08-18 DIAGNOSIS — G8929 Other chronic pain: Secondary | ICD-10-CM

## 2013-08-18 DIAGNOSIS — M542 Cervicalgia: Secondary | ICD-10-CM

## 2013-08-18 DIAGNOSIS — Z818 Family history of other mental and behavioral disorders: Secondary | ICD-10-CM

## 2013-08-18 DIAGNOSIS — M25519 Pain in unspecified shoulder: Secondary | ICD-10-CM

## 2013-08-18 DIAGNOSIS — M25512 Pain in left shoulder: Secondary | ICD-10-CM

## 2013-08-18 DIAGNOSIS — B1920 Unspecified viral hepatitis C without hepatic coma: Secondary | ICD-10-CM

## 2013-08-18 DIAGNOSIS — G4700 Insomnia, unspecified: Secondary | ICD-10-CM

## 2013-08-18 MED ORDER — HYDROCODONE-IBUPROFEN 7.5-200 MG PO TABS: 1.0000 | ORAL_TABLET | Freq: Three times a day (TID) | ORAL | Status: DC | PRN

## 2013-08-18 MED ORDER — METHOCARBAMOL 750 MG PO TABS: 750.0000 mg | ORAL_TABLET | Freq: Four times a day (QID) | ORAL | Status: DC

## 2013-08-18 NOTE — Progress Notes (Signed)
   Subjective:    Patient ID: Tanner Webb, male    DOB: [birth year], 63 y.o.   MRN: [id number]  HPI Pt here c/o f left shoulder pain that started last Thursday January the 1st." Pain feels like somebody punched me and it hurts more movement". Have tried thermo patch and vicoprofen with some relief. Pt went to work today, he works at [employer]. 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 He does not have any more medication for pain. No other complains.     Review of Systems     Objective:   Physical Exam Limited ROM in left arm and with neck movement      Assessment & Plan:  Ice and rest shoulder and arm next 2 days. Hydrocodone and muscle relaxer. Out of work 2 days.  Neck manual to follow for exercises.

## 2013-08-18 NOTE — Progress Notes (Signed)
   Subjective:    Patient ID: Tanner Webb, male    DOB: 02/04/1951, 62 y.o.   MRN: [id number]  HPI    Review of Systems     Objective:   Physical Exam        Assessment & Plan:   

## 2013-08-18 NOTE — Patient Instructions (Signed)
Back Pain, Adult Low back pain is very common. About 1 in 5 people have back pain.The cause of low back pain is rarely dangerous. The pain often gets better over time.About half of people with a sudden onset of back pain feel better in just 2 weeks. About 8 in 10 people feel better by 6 weeks.  CAUSES Some common causes of back pain include:  Strain of the muscles or ligaments supporting the spine.  Wear and tear (degeneration) of the spinal discs.  Arthritis.  Direct injury to the back. DIAGNOSIS Most of the time, the direct cause of low back pain is not known.However, back pain can be treated effectively even when the exact cause of the pain is unknown.Answering your caregiver's questions about your overall health and symptoms is one of the most accurate ways to make sure the cause of your pain is not dangerous. If your caregiver needs more information, he or she may order lab work or imaging tests (X-rays or MRIs).However, even if imaging tests show changes in your back, this usually does not require surgery. HOME CARE INSTRUCTIONS For many people, back pain returns.Since low back pain is rarely dangerous, it is often a condition that people can learn to manageon their own.   Remain active. It is stressful on the back to sit or stand in one place. Do not sit, drive, or stand in one place for more than 30 minutes at a time. Take short walks on level surfaces as soon as pain allows.Try to increase the length of time you walk each day.  Do not stay in bed.Resting more than 1 or 2 days can delay your recovery.  Do not avoid exercise or work.Your body is made to move.It is not dangerous to be active, even though your back may hurt.Your back will likely heal faster if you return to being active before your pain is gone.  Pay attention to your body when you bend and lift. Many people have less discomfortwhen lifting if they bend their knees, keep the load close to their bodies,and  avoid twisting. Often, the most comfortable positions are those that put less stress on your recovering back.  Find a comfortable position to sleep. Use a firm mattress and lie on your side with your knees slightly bent. If you lie on your back, put a pillow under your knees.  Only take over-the-counter or prescription medicines as directed by your caregiver. Over-the-counter medicines to reduce pain and inflammation are often the most helpful.Your caregiver may prescribe muscle relaxant drugs.These medicines help dull your pain so you can more quickly return to your normal activities and healthy exercise.  Put ice on the injured area.  Put ice in a plastic bag.  Place a towel between your skin and the bag.  Leave the ice on for 15-20 minutes, 03-04 times a day for the first 2 to 3 days. After that, ice and heat may be alternated to reduce pain and spasms.  Ask your caregiver about trying back exercises and gentle massage. This may be of some benefit.  Avoid feeling anxious or stressed.Stress increases muscle tension and can worsen back pain.It is important to recognize when you are anxious or stressed and learn ways to manage it.Exercise is a great option. SEEK MEDICAL CARE IF:  You have pain that is not relieved with rest or medicine.  You have pain that does not improve in 1 week.  You have new symptoms.  You are generally not feeling well. SEEK   IMMEDIATE MEDICAL CARE IF:   You have pain that radiates from your back into your legs.  You develop new bowel or bladder control problems.  You have unusual weakness or numbness in your arms or legs.  You develop nausea or vomiting.  You develop abdominal pain.  You feel faint. Document Released: 07/31/2005 Document Revised: 01/30/2012 Document Reviewed: 12/19/2010 [employer] Patient Information 2014 [employer].  

## 2013-10-24 ENCOUNTER — Ambulatory Visit (INDEPENDENT_AMBULATORY_CARE_PROVIDER_SITE_OTHER): Payer: 59 | Admitting: Emergency Medicine

## 2013-10-24 VITALS — BP 140/80 | HR 72 | Temp 97.5°F | Resp 16 | Ht 75.0 in | Wt 229.0 lb

## 2013-10-24 DIAGNOSIS — F17200 Nicotine dependence, unspecified, uncomplicated: Secondary | ICD-10-CM

## 2013-10-24 DIAGNOSIS — B182 Chronic viral hepatitis C: Secondary | ICD-10-CM

## 2013-10-24 DIAGNOSIS — R635 Abnormal weight gain: Secondary | ICD-10-CM

## 2013-10-24 DIAGNOSIS — I10 Essential (primary) hypertension: Secondary | ICD-10-CM

## 2013-10-24 LAB — POCT CBC
Granulocyte percent: 49.4 %G (ref 37–80)
HCT, POC: 49.5 % (ref 43.5–53.7)
Hemoglobin: 16.2 g/dL (ref 14.1–18.1)
Lymph, poc: 4.7 — AB (ref 0.6–3.4)
MCH: 32.3 pg — AB (ref 27–31.2)
MCHC: 32.7 g/dL (ref 31.8–35.4)
MCV: 98.8 fL — AB (ref 80–97)
MID (CBC): 1.2 — AB (ref 0–0.9)
MPV: 8.9 fL (ref 0–99.8)
PLATELET COUNT, POC: 463 10*3/uL — AB (ref 142–424)
POC GRANULOCYTE: 5.6 (ref 2–6.9)
POC LYMPH PERCENT: 40.8 %L (ref 10–50)
POC MID %: 10.1 % (ref 0–12)
RBC: 5.01 M/uL (ref 4.69–6.13)
RDW, POC: 14.5 %
WBC: 11.4 10*3/uL — AB (ref 4.6–10.2)

## 2013-10-24 MED ORDER — AMLODIPINE BESYLATE 5 MG PO TABS: 5.0000 mg | ORAL_TABLET | Freq: Every day | ORAL | Status: DC

## 2013-10-24 NOTE — Progress Notes (Deleted)
   Subjective:    Patient ID: Tanner Webb, male    DOB: 1951-04-13, 63 y.o.   MRN: [id number]  HPI presents today for elevated BP readings. He has been going to [facility] to check BP by the automatic machine. He is a smoker of 1-1 1/2 PPD. Has been eating more lately. He does have stresses with mother who has dementia/ahlzheimer's. His uncle has had a couple of strokes. Recheck BP R arm 150/90, L arm     Review of Systems     Objective:   Physical Exam        Assessment & Plan:

## 2013-10-24 NOTE — Progress Notes (Addendum)
Subjective:    Patient ID: Tanner Webb, male    DOB: [birth year], 63 y.o.   MRN: [id number]  HPI  Scribed for Tanner Webb, the patient was seen in room 10. This chart was scribed by Denice Bors, ED scribe. Patient's care was started at 5:04 PM  HPI Comments: Hx was provided by the pt.  Tanner Webb is a 63 y.o. male who presents to the [facility] complaining of elevated BP readings onset for the last month. He has been going to [facility] to check BP by the automatic machine. He is a smoker of 1-1 1/2 PPD. Denies associated chest pain, and shortness of breath. Has been eating more lately. He does have stresses with mother who has dementia/ahlzheimer's. His uncle has had a couple of strokes. Recheck BP R arm 150/90, and L arm.  Past Medical History  Diagnosis Date  . Chronic pain in left shoulder   . Hyperlipidemia   . Hepatitis C infection     Past Surgical History  Procedure Laterality Date  . Cholecystectomy      History reviewed. No pertinent family history.  History   Social History  . Marital Status: Single    Spouse Name: N/A    Number of Children: N/A  . Years of Education: N/A   Occupational History  . Not on file.   Social History Main Topics  . Smoking status: Former Smoker    Quit date: 02/28/2012  . Smokeless tobacco: Not on file  . Alcohol Use: No  . Drug Use: No  . Sexual Activity: Yes   Other Topics Concern  . Not on file   Social History Narrative  . No narrative on file    Allergies  Allergen Reactions  . Penicillins   . Sulfa Antibiotics     Patient Active Problem List   Diagnosis Date Noted  . Hepatitis C 10/06/2012  . Chronic pain in left shoulder 03/30/2012  . Anemia 03/30/2012    Filed Vitals:   10/24/13 1557  BP: 140/80  Pulse: 72  Temp: 97.5 F (36.4 C)  TempSrc: Oral  Resp: 16  Height: 6\' 3"  (1.905 m)  Weight: 229 lb (103.874 kg)  SpO2: 98%           Review  of Systems A complete 10 system review of systems was obtained and all systems are negative except as noted in the HPI and PMHx.       Objective:   Physical Exam   Physical Exam  Vitals reviewed. Constitutional: He is oriented to person, place, and time. He appears well-developed and well-nourished. No distress.  HENT:  Head: Normocephalic and atraumatic.  Throat: Oropharynx is clear and moist. Mucous membranes normal. Uvula is midline.  Eyes: EOM are normal.  Neck: Neck supple. No tracheal deviation present.  Cardiovascular: Normal rate.   Pulmonary/Chest: Effort normal. No respiratory distress.  Musculoskeletal: Normal range of motion.  Neurological: He is alert and oriented to person, place, and time.  Skin: Skin is warm and dry.  Psychiatric: He has a normal mood and affect. His behavior is normal.   Orders Placed This Encounter  Procedures  . Comprehensive metabolic panel  . TSH  . POCT CBC    No results found for this or any previous visit (from the past 24 hour(s)).  Results for orders placed in visit on 10/24/13  POCT CBC      Result Value Ref Range  WBC 11.4 (*) 4.6 - 10.2 K/uL   Lymph, poc 4.7 (*) 0.6 - 3.4   POC LYMPH PERCENT 40.8  10 - 50 %L   MID (cbc) 1.2 (*) 0 - 0.9   POC MID % 10.1  0 - 12 %M   POC Granulocyte 5.6  2 - 6.9   Granulocyte percent 49.4  37 - 80 %G   RBC 5.01  4.69 - 6.13 M/uL   Hemoglobin 16.2  14.1 - 18.1 g/dL   HCT, POC 49.5  43.5 - 53.7 %   MCV 98.8 (*) 80 - 97 fL   MCH, POC 32.3 (*) 27 - 31.2 pg   MCHC 32.7  31.8 - 35.4 g/dL   RDW, POC 14.5     Platelet Count, POC 463 (*) 142 - 424 K/uL   MPV 8.9  0 - 99.8 fL        Assessment & Plan:  We'll try Norvasc 5 mg one a day. Advised patient to make an appointment for complete physical. Routine labs were done has a history of hep C. 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 I personally performed the services described in this  documentation, which was scribed in my presence. The recorded information has been reviewed and is accurate.

## 2013-10-24 NOTE — Patient Instructions (Signed)
Hypertension As your heart beats, it forces blood through your arteries. This force is your blood pressure. If the pressure is too high, it is called hypertension (HTN) or high blood pressure. HTN is dangerous because you may have it and not know it. High blood pressure may mean that your heart has to work harder to pump blood. Your arteries may be narrow or stiff. The extra work puts you at risk for heart disease, stroke, and other problems.  Blood pressure consists of two numbers, a higher number over a lower, 110/72, for example. It is stated as "110 over 72." The ideal is below 120 for the top number (systolic) and under 80 for the bottom (diastolic). Write down your blood pressure today. You should pay close attention to your blood pressure if you have certain conditions such as:  Heart failure.  Prior heart attack.  Diabetes  Chronic kidney disease.  Prior stroke.  Multiple risk factors for heart disease. To see if you have HTN, your blood pressure should be measured while you are seated with your arm held at the level of the heart. It should be measured at least twice. A one-time elevated blood pressure reading (especially in the Emergency Department) does not mean that you need treatment. There may be conditions in which the blood pressure is different between your right and left arms. It is important to see your caregiver soon for a recheck. Most people have essential hypertension which means that there is not a specific cause. This type of high blood pressure may be lowered by changing lifestyle factors such as:  Stress.  Smoking.  Lack of exercise.  Excessive weight.  Drug/tobacco/alcohol use.  Eating less salt. Most people do not have symptoms from high blood pressure until it has caused damage to the body. Effective treatment can often prevent, delay or reduce that damage. TREATMENT  When a cause has been identified, treatment for high blood pressure is directed at the  cause. There are a large number of medications to treat HTN. These fall into several categories, and your caregiver will help you select the medicines that are best for you. Medications may have side effects. You should review side effects with your caregiver. If your blood pressure stays high after you have made lifestyle changes or started on medicines,   Your medication(s) may need to be changed.  Other problems may need to be addressed.  Be certain you understand your prescriptions, and know how and when to take your medicine.  Be sure to follow up with your caregiver within the time frame advised (usually within two weeks) to have your blood pressure rechecked and to review your medications.  If you are taking more than one medicine to lower your blood pressure, make sure you know how and at what times they should be taken. Taking two medicines at the same time can result in blood pressure that is too low. SEEK IMMEDIATE MEDICAL CARE IF:  You develop a severe headache, blurred or changing vision, or confusion.  You have unusual weakness or numbness, or a faint feeling.  You have severe chest or abdominal pain, vomiting, or breathing problems. MAKE SURE YOU:   Understand these instructions.  Will watch your condition.  Will get help right away if you are not doing well or get worse. Document Released: 07/31/2005 Document Revised: 10/23/2011 Document Reviewed: 03/20/2008 [facility] Patient Information 2014 [employer]. Smoking Cessation Quitting smoking is important to your health and has many advantages. However, it is  not always easy to quit since nicotine is a very addictive drug. Often times, people try 3 times or more before being able to quit. This document explains the best ways for you to prepare to quit smoking. Quitting takes hard work and a lot of effort, but you can do it. ADVANTAGES OF QUITTING SMOKING  You will live longer, feel better, and live better.  Your body  will feel the impact of quitting smoking almost immediately.  Within 20 minutes, blood pressure decreases. Your pulse returns to its normal level.  After 8 hours, carbon monoxide levels in the blood return to normal. Your oxygen level increases.  After 24 hours, the chance of having a heart attack starts to decrease. Your breath, hair, and body stop smelling like smoke.  After 48 hours, damaged nerve endings begin to recover. Your sense of taste and smell improve.  After 72 hours, the body is virtually free of nicotine. Your bronchial tubes relax and breathing becomes easier.  After 2 to 12 weeks, lungs can hold more air. Exercise becomes easier and circulation improves.  The risk of having a heart attack, stroke, cancer, or lung disease is greatly reduced.  After 1 year, the risk of coronary heart disease is cut in half.  After 5 years, the risk of stroke falls to the same as a nonsmoker.  After 10 years, the risk of lung cancer is cut in half and the risk of other cancers decreases significantly.  After 15 years, the risk of coronary heart disease drops, usually to the level of a nonsmoker.  If you are pregnant, quitting smoking will improve your chances of having a healthy baby.  The people you live with, especially any children, will be healthier.  You will have extra money to spend on things other than cigarettes. QUESTIONS TO THINK ABOUT BEFORE ATTEMPTING TO QUIT You may want to talk about your answers with your caregiver.  Why do you want to quit?  If you tried to quit in the past, what helped and what did not?  What will be the most difficult situations for you after you quit? How will you plan to handle them?  Who can help you through the tough times? Your family? Friends? A caregiver?  What pleasures do you get from smoking? What ways can you still get pleasure if you quit? Here are some questions to ask your caregiver:  How can you help me to be successful at  quitting?  What medicine do you think would be best for me and how should I take it?  What should I do if I need more help?  What is smoking withdrawal like? How can I get information on withdrawal? GET READY  Set a quit date.  Change your environment by getting rid of all cigarettes, ashtrays, matches, and lighters in your home, car, or work. Do not let people smoke in your home.  Review your past attempts to quit. Think about what worked and what did not. GET SUPPORT AND ENCOURAGEMENT You have a better chance of being successful if you have help. You can get support in many ways.  Tell your family, friends, and co-workers that you are going to quit and need their support. Ask them not to smoke around you.  Get individual, group, or telephone counseling and support. Programs are available at [employer] and health centers. Call your local health department for information about programs in your area.  Spiritual beliefs and practices may help some smokers  quit.  Hollace Kinnier a "quit meter" on your computer to keep track of quit statistics, such as how long you have gone without smoking, cigarettes not smoked, and money saved.  Get a self-help book about quitting smoking and staying off of tobacco. [place] yourself from urges to smoke. Talk to someone, go for a walk, or occupy your time with a task.  Change your normal routine. Take a different route to work. Drink tea instead of coffee. Eat breakfast in a different place.  Reduce your stress. Take a hot bath, exercise, or read a book.  Plan something enjoyable to do every day. Reward yourself for not smoking.  Explore interactive web-based programs that specialize in helping you quit. GET MEDICINE AND USE IT CORRECTLY Medicines can help you stop smoking and decrease the urge to smoke. Combining medicine with the above behavioral methods and support can greatly increase your chances of  successfully quitting smoking.  Nicotine replacement therapy helps deliver nicotine to your body without the negative effects and risks of smoking. Nicotine replacement therapy includes nicotine gum, lozenges, inhalers, nasal sprays, and skin patches. Some may be available over-the-counter and others require a prescription.  Antidepressant medicine helps people abstain from smoking, but how this works is unknown. This medicine is available by prescription.  Nicotinic receptor partial agonist medicine simulates the effect of nicotine in your brain. This medicine is available by prescription. Ask your caregiver for advice about which medicines to use and how to use them based on your health history. Your caregiver will tell you what side effects to look out for if you choose to be on a medicine or therapy. Carefully read the information on the package. Do not use any other product containing nicotine while using a nicotine replacement product.  RELAPSE OR DIFFICULT SITUATIONS Most relapses occur within the first 3 months after quitting. Do not be discouraged if you start smoking again. Remember, most people try several times before finally quitting. You may have symptoms of withdrawal because your body is used to nicotine. You may crave cigarettes, be irritable, feel very hungry, cough often, get headaches, or have difficulty concentrating. The withdrawal symptoms are only temporary. They are strongest when you first quit, but they will go away within 10 14 days. To reduce the chances of relapse, try to:  Avoid drinking alcohol. Drinking lowers your chances of successfully quitting.  Reduce the amount of caffeine you consume. Once you quit smoking, the amount of caffeine in your body increases and can give you symptoms, such as a rapid heartbeat, sweating, and anxiety.  Avoid smokers because they can make you want to smoke.  Do not let weight gain distract you. Many smokers will gain weight when they  quit, usually less than 10 pounds. Eat a healthy diet and stay active. You can always lose the weight gained after you quit.  Find ways to improve your mood other than smoking. FOR MORE INFORMATION  www.smokefree.gov  Document Released: 07/25/2001 Document Revised: 01/30/2012 Document Reviewed: 11/09/2011 [facility] Patient Information 2014 [place], [place].

## 2013-10-25 LAB — COMPREHENSIVE METABOLIC PANEL
ALT: 68 U/L — ABNORMAL HIGH (ref 0–53)
AST: 52 U/L — AB (ref 0–37)
Albumin: 4.2 g/dL (ref 3.5–5.2)
Alkaline Phosphatase: 77 U/L (ref 39–117)
BILIRUBIN TOTAL: 0.6 mg/dL (ref 0.2–1.2)
BUN: 15 mg/dL (ref 6–23)
CHLORIDE: 103 meq/L (ref 96–112)
CO2: 30 mEq/L (ref 19–32)
CREATININE: 0.74 mg/dL (ref 0.50–1.35)
Calcium: 9.5 mg/dL (ref 8.4–10.5)
Glucose, Bld: 77 mg/dL (ref 70–99)
Potassium: 4.4 mEq/L (ref 3.5–5.3)
Sodium: 138 mEq/L (ref 135–145)
TOTAL PROTEIN: 7 g/dL (ref 6.0–8.3)

## 2013-10-25 LAB — TSH: TSH: 1.453 u[IU]/mL (ref 0.350–4.500)

## 2013-11-11 ENCOUNTER — Ambulatory Visit (INDEPENDENT_AMBULATORY_CARE_PROVIDER_SITE_OTHER): Payer: 59 | Admitting: Internal Medicine

## 2013-11-11 VITALS — BP 152/86 | HR 84 | Temp 97.3°F | Resp 18 | Ht 75.0 in | Wt 226.0 lb

## 2013-11-11 DIAGNOSIS — M542 Cervicalgia: Secondary | ICD-10-CM

## 2013-11-11 DIAGNOSIS — I10 Essential (primary) hypertension: Secondary | ICD-10-CM

## 2013-11-11 DIAGNOSIS — Z818 Family history of other mental and behavioral disorders: Secondary | ICD-10-CM

## 2013-11-11 DIAGNOSIS — G8929 Other chronic pain: Secondary | ICD-10-CM

## 2013-11-11 DIAGNOSIS — M25512 Pain in left shoulder: Secondary | ICD-10-CM

## 2013-11-11 DIAGNOSIS — M25519 Pain in unspecified shoulder: Secondary | ICD-10-CM

## 2013-11-11 DIAGNOSIS — G4700 Insomnia, unspecified: Secondary | ICD-10-CM

## 2013-11-11 DIAGNOSIS — Z7189 Other specified counseling: Secondary | ICD-10-CM

## 2013-11-11 DIAGNOSIS — B1920 Unspecified viral hepatitis C without hepatic coma: Secondary | ICD-10-CM

## 2013-11-11 MED ORDER — ALPRAZOLAM 0.25 MG PO TABS: 0.2500 mg | ORAL_TABLET | Freq: Every evening | ORAL | Status: DC | PRN

## 2013-11-11 MED ORDER — METHOCARBAMOL 750 MG PO TABS: 750.0000 mg | ORAL_TABLET | Freq: Four times a day (QID) | ORAL | Status: DC

## 2013-11-11 MED ORDER — HYDROCODONE-IBUPROFEN 7.5-200 MG PO TABS: 1.0000 | ORAL_TABLET | Freq: Three times a day (TID) | ORAL | Status: DC | PRN

## 2013-11-11 MED ORDER — AMLODIPINE BESYLATE 10 MG PO TABS: 10.0000 mg | ORAL_TABLET | Freq: Every day | ORAL | Status: DC

## 2013-11-11 NOTE — Patient Instructions (Signed)

## 2013-11-11 NOTE — Progress Notes (Signed)
   Subjective:    Patient ID: Tanner Webb, male    DOB: [birth year], 63 y.o.   MRN: [id number]  HPI 63 yr old Caucasian male is here today with complaints of elevated blood pressure and requesting medication refill on Xanax and Vicoprofen. He states that his blood pressure is still staying elevated with him on medication and wants to know should he be on something different to help.  Trying to quit smoking, is using nicotene patches. Chronic shoulder pain, insomnia needs rfs. HTN not to goal  Review of Systems     Objective:   Physical Exam  Constitutional: He is oriented to person, place, and time. He appears well-developed and well-nourished. No distress.  HENT:  Head: Normocephalic.  Eyes: EOM are normal.  Neck: Normal range of motion. Neck supple.  Cardiovascular: Normal rate, regular rhythm and normal heart sounds.   Pulmonary/Chest: Effort normal and breath sounds normal.  Musculoskeletal:       Left shoulder: He exhibits decreased range of motion, tenderness and pain. He exhibits no bony tenderness, no swelling, no effusion, no crepitus, no deformity, no spasm, normal pulse and normal strength.  Neurological: He is alert and oriented to person, place, and time. He exhibits normal muscle tone. Coordination normal.  Psychiatric: He has a normal mood and affect. His behavior is normal. Thought content normal.    BP 138/96 by me.      Assessment & Plan:  Chronic pain/Insomnia/HTN/Nicotine addiction Increase amlodipine 10mg  qd RF meds

## 2013-12-23 ENCOUNTER — Telehealth: Payer: Self-pay

## 2013-12-23 ENCOUNTER — Ambulatory Visit (INDEPENDENT_AMBULATORY_CARE_PROVIDER_SITE_OTHER): Payer: 59 | Admitting: Emergency Medicine

## 2013-12-23 VITALS — BP 128/78 | HR 61 | Temp 97.5°F | Resp 16 | Ht 74.0 in | Wt 226.6 lb

## 2013-12-23 DIAGNOSIS — L255 Unspecified contact dermatitis due to plants, except food: Secondary | ICD-10-CM

## 2013-12-23 MED ORDER — METHYLPREDNISOLONE ACETATE 80 MG/ML IJ SUSP
120.0000 mg | Freq: Once | INTRAMUSCULAR | Status: AC
  Administered 2013-12-23: 120 mg via INTRAMUSCULAR

## 2013-12-23 NOTE — Patient Instructions (Signed)
Poison Ivy Poison ivy is a inflammation of the skin (contact dermatitis) caused by touching the allergens on the leaves of the ivy plant following previous exposure to the plant. The rash usually appears 48 hours after exposure. The rash is usually bumps (papules) or blisters (vesicles) in a linear pattern. Depending on your own sensitivity, the rash may simply cause redness and itching, or it may also progress to blisters which may break open. These must be well cared for to prevent secondary bacterial (germ) infection, followed by scarring. Keep any open areas dry, clean, dressed, and covered with an antibacterial ointment if needed. The eyes may also get puffy. The puffiness is worst in the morning and gets better as the day progresses. This dermatitis usually heals without scarring, within 2 to 3 weeks without treatment. HOME CARE INSTRUCTIONS  Thoroughly wash with soap and water as soon as you have been exposed to poison ivy. You have about one half hour to remove the plant resin before it will cause the rash. This washing will destroy the oil or antigen on the skin that is causing, or will cause, the rash. Be sure to wash under your fingernails as any plant resin there will continue to spread the rash. Do not rub skin vigorously when washing affected area. Poison ivy cannot spread if no oil from the plant remains on your body. A rash that has progressed to weeping sores will not spread the rash unless you have not washed thoroughly. It is also important to wash any clothes you have been wearing as these may carry active allergens. The rash will return if you wear the unwashed clothing, even several days later. Avoidance of the plant in the future is the best measure. Poison ivy plant can be recognized by the number of leaves. Generally, poison ivy has three leaves with flowering branches on a single stem. Diphenhydramine may be purchased over the counter and used as needed for itching. Do not drive with  this medication if it makes you drowsy.Ask your caregiver about medication for children. SEEK MEDICAL CARE IF:  Open sores develop.  Redness spreads beyond area of rash.  You notice purulent (pus-like) discharge.  You have increased pain.  Other signs of infection develop (such as fever). Document Released: 07/28/2000 Document Revised: 10/23/2011 Document Reviewed: 06/16/2009 [employer] Patient Information 2014 [employer].  

## 2013-12-23 NOTE — Telephone Encounter (Signed)
Dr Ouida Sills wrote [facility] note for pt. Called pt and LMOM to CB to advise if pt wants to P/up or wants it faxed to work? Letter in [place] Rx box.

## 2013-12-23 NOTE — Progress Notes (Signed)
[facility] [address], [place] [address] [phone]  Date:  12/23/2013   Name:  Tanner Webb   DOB:  [birth year]   MRN:  [id number]  PCP:  Kennon Portela, MD    Chief Complaint: Poison Oak   History of Present Illness:  Tanner Webb is a 63 y.o. very pleasant male patient who presents with the following:  Poison ivy on penis  Patient Active Problem List   Diagnosis Date Noted  . Unspecified essential hypertension 10/24/2013  . Hepatitis C 10/06/2012  . Chronic pain in left shoulder 03/30/2012  . Anemia 03/30/2012    Past Medical History  Diagnosis Date  . Chronic pain in left shoulder   . Hyperlipidemia   . Hepatitis C infection     Past Surgical History  Procedure Laterality Date  . Cholecystectomy      History  Substance Use Topics  . Smoking status: Former Smoker    Quit date: 02/28/2012  . Smokeless tobacco: Not on file  . Alcohol Use: No    No family history on file.  Allergies  Allergen Reactions  . Penicillins   . Sulfa Antibiotics     Medication list has been reviewed and updated.  Current Outpatient Prescriptions on File Prior to Visit  Medication Sig Dispense Refill  . ALPRAZolam (XANAX) 0.25 MG tablet Take 1 tablet (0.25 mg total) by mouth at bedtime as needed.  30 tablet  5  . HYDROcodone-ibuprofen (VICOPROFEN) 7.5-200 MG per tablet Take 1 tablet by mouth every 8 (eight) hours as needed.  90 tablet  0  . methocarbamol (ROBAXIN-750) 750 MG tablet Take 1 tablet (750 mg total) by mouth 4 (four) times daily.  40 tablet  1  . amitriptyline (ELAVIL) 100 MG tablet Take 1 tablet (100 mg total) by mouth at bedtime.  30 tablet  5  . amLODipine (NORVASC) 10 MG tablet Take 1 tablet (10 mg total) by mouth daily.  90 tablet  3  . tetrahydrozoline-zinc (VISINE-AC) 0.05-0.25 % ophthalmic solution 2 drops 3 (three) times daily as needed.       No current facility-administered medications on file prior to visit.     Review of Systems:  As per HPI, otherwise negative.    Physical Examination: Filed Vitals:   12/23/13 0816  BP: 128/78  Pulse: 61  Temp: 97.5 F (36.4 C)  Resp: 16   Filed Vitals:   12/23/13 0816  Height: 6\' 2"  (1.88 m)  Weight: 226 lb 9.6 oz (102.785 kg)   Body mass index is 29.08 kg/(m^2). Ideal Body Weight: Weight in (lb) to have BMI = 25: 194.3   GEN: WDWN, NAD, Non-toxic, Alert & Oriented x 3 HEENT: Atraumatic, Normocephalic.  Ears and Nose: No external deformity. EXTR: No clubbing/cyanosis/edema NEURO: Normal gait.  PSYCH: Normally interactive. Conversant. Not depressed or anxious appearing.  Calm demeanor.  Genitalia:  Normal male circumcised.  Contact dermatitis phallus.  Assessment and Plan: Contact dermatitits Depo medrol   Signed,  Ellison Carwin, MD

## 2013-12-29 ENCOUNTER — Encounter: Payer: Self-pay | Admitting: Family Medicine

## 2013-12-29 ENCOUNTER — Ambulatory Visit (INDEPENDENT_AMBULATORY_CARE_PROVIDER_SITE_OTHER): Payer: 59 | Admitting: Family Medicine

## 2013-12-29 VITALS — BP 136/84 | HR 65 | Resp 16 | Wt 226.0 lb

## 2013-12-29 DIAGNOSIS — Z125 Encounter for screening for malignant neoplasm of prostate: Secondary | ICD-10-CM

## 2013-12-29 DIAGNOSIS — Z818 Family history of other mental and behavioral disorders: Secondary | ICD-10-CM

## 2013-12-29 DIAGNOSIS — G8929 Other chronic pain: Secondary | ICD-10-CM

## 2013-12-29 DIAGNOSIS — B1920 Unspecified viral hepatitis C without hepatic coma: Secondary | ICD-10-CM

## 2013-12-29 DIAGNOSIS — Z0000 Encounter for general adult medical examination without abnormal findings: Secondary | ICD-10-CM

## 2013-12-29 DIAGNOSIS — R600 Localized edema: Secondary | ICD-10-CM

## 2013-12-29 DIAGNOSIS — R609 Edema, unspecified: Secondary | ICD-10-CM

## 2013-12-29 DIAGNOSIS — Z13220 Encounter for screening for lipoid disorders: Secondary | ICD-10-CM

## 2013-12-29 DIAGNOSIS — M25512 Pain in left shoulder: Secondary | ICD-10-CM

## 2013-12-29 DIAGNOSIS — G4700 Insomnia, unspecified: Secondary | ICD-10-CM

## 2013-12-29 LAB — LIPID PANEL
CHOL/HDL RATIO: 2.6 ratio
Cholesterol: 151 mg/dL (ref 0–200)
HDL: 57 mg/dL (ref >39)
LDL CALC: 81 mg/dL (ref 0–99)
Triglycerides: 66 mg/dL (ref <150)
VLDL: 13 mg/dL (ref 0–40)

## 2013-12-29 LAB — BRAIN NATRIURETIC PEPTIDE: Brain Natriuretic Peptide: 20.4 pg/mL (ref 0.0–100.0)

## 2013-12-29 MED ORDER — ALPRAZOLAM 0.5 MG PO TABS: 0.5000 mg | ORAL_TABLET | Freq: Every evening | ORAL | Status: DC | PRN

## 2013-12-29 MED ORDER — FUROSEMIDE 20 MG PO TABS
20.0000 mg | ORAL_TABLET | Freq: Every day | ORAL | Status: DC
Start: 2013-12-29 — End: 2014-06-10

## 2013-12-29 NOTE — Progress Notes (Signed)
[facility] [address] [place] [address] [phone]  Date:  12/29/2013   Name:  Tanner Webb   DOB:  August 05, 1951   MRN:  [id number]  PCP:  Kennon Portela, MD    Chief Complaint: Annual Exam   History of Present Illness:  Tanner Webb is a 63 y.o. very pleasant male patient who presents with the following:  Here today for a CPE.  History of HTN, Hep C.   He also notes swelling in his feet and legs that will come and go. He has noted this for about 2 months.  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  He was off of work last week and they got better.  He notes that the swelling will be significant at times.  He gets up at 3:30 am in order to be at work at [employer]- he has a long commute, and then will work over 10 hours prior to driving home.  Admits that this schedule is really not sustainable for him and is causing fatigue and malaise.   He notes an occasional rectal pain- maybe every 3 or 4 months. Does not persist.  No records of a recent colonoscopy He had a little cough this am, now resolved.  He also notes "increased thirst" all his life.  He drinks a lot of diet caffeine free mountain dew during the day.  He does not really drink any water.    Also he notes some sadness as his mother is ill with Alzheimer disease.  He moved to [place] to live closer to her; she is still in her home but may not be able to stay there much longer.   He does not feel that he is depressed or suicidal however.   He now takes xanax at bedtime for sleep- currently taking 0.25 mg.  However he notes this does not always help him sleep, wonders if he could go up on his dose a little bit   He also has a rotator cuff injury- he is treated with robaxin and vicodin asneeded.   He is fasting today, due for a PSA  Wt Readings from Last 3 Encounters:  12/29/13 226 lb (102.513 kg)  12/23/13 226 lb 9.6 oz (102.785 kg)  11/11/13 226 lb (102.513 kg)      Patient Active Problem List   Diagnosis Date Noted  . Unspecified essential hypertension 10/24/2013  . Hepatitis C 10/06/2012  . Chronic pain in left shoulder 03/30/2012  . Anemia 03/30/2012    Past Medical History  Diagnosis Date  . Chronic pain in left shoulder   . Hyperlipidemia   . Hepatitis C infection     Past Surgical History  Procedure Laterality Date  . Cholecystectomy      History  Substance Use Topics  . Smoking status: Former Smoker    Quit date: 02/28/2012  . Smokeless tobacco: Not on file  . Alcohol Use: No    History reviewed. No pertinent family history.  Allergies  Allergen Reactions  . Penicillins   . Sulfa Antibiotics     Medication list has been reviewed and updated.  Current Outpatient Prescriptions on File Prior to Visit  Medication Sig Dispense Refill  . ALPRAZolam (XANAX) 0.25 MG tablet Take 1 tablet (0.25 mg total) by mouth at bedtime as needed.  30 tablet  5  . amLODipine (NORVASC) 10 MG tablet Take 1 tablet (10  mg total) by mouth daily.  90 tablet  3  . HYDROcodone-ibuprofen (VICOPROFEN) 7.5-200 MG per tablet Take 1 tablet by mouth every 8 (eight) hours as needed.  90 tablet  0  . methocarbamol (ROBAXIN-750) 750 MG tablet Take 1 tablet (750 mg total) by mouth 4 (four) times daily.  40 tablet  1  . amitriptyline (ELAVIL) 100 MG tablet Take 1 tablet (100 mg total) by mouth at bedtime.  30 tablet  5  . tetrahydrozoline-zinc (VISINE-AC) 0.05-0.25 % ophthalmic solution 2 drops 3 (three) times daily as needed.       No current facility-administered medications on file prior to visit.    Review of Systems:  As per HPI- otherwise negative.   Physical Examination: Filed Vitals:   12/29/13 0850  BP: 136/84  Pulse: 65  Resp: 16   Filed Vitals:   12/29/13 0850  Weight: 226 lb (102.513 kg)   Body mass index is 29 kg/(m^2). Ideal Body Weight:    GEN: WDWN, NAD, Non-toxic, A & O x 3, large build, looks well HEENT: Atraumatic,  Normocephalic. Neck supple. No masses, No LAD. Bilateral TM wnl, oropharynx normal.  PEERL,EOMI.   Ears and Nose: No external deformity. CV: RRR, No M/G/R. No JVD. No thrill. No extra heart sounds. PULM: CTA B, no wheezes, crackles, rhonchi. No retractions. No resp. distress. No accessory muscle use. ABD: S, NT, ND, +BS. No rebound. No HSM. EXTR: No c/c/e.  At this time he does not have any visible LE edema NEURO Normal gait.  PSYCH: Normally interactive. Conversant. Not depressed or anxious appearing.  Calm demeanor.  Gu: normal exam, no scrotal masses or tenderness, normal penis Normal DRE, no masses or tenderness    Assessment and Plan: Pedal edema - Plan: Brain natriuretic peptide, furosemide (LASIX) 20 MG tablet, CANCELED: Pro b natriuretic peptide (BNP)  Screening for hyperlipidemia - Plan: Lipid panel, CANCELED: Lipid panel  Screening for prostate cancer - Plan: PSA, CANCELED: PSA  Hepatitis C - Plan: ALPRAZolam (XANAX) 0.5 MG tablet  Insomnia - Plan: ALPRAZolam (XANAX) 0.5 MG tablet  Family history of stress - Plan: ALPRAZolam (XANAX) 0.5 MG tablet  Chronic pain in left shoulder - Plan: ALPRAZolam (XANAX) 0.5 MG tablet  Increased his alprazolam to 0.5mg  qhs as needed He may use lasix 20 mg as needed for occasional swelling.  Check BNP PSA today Encouraged to think about changes in his lifestyle; right now with his commute and long hours he does not have enough time for adequate sleep and leisure time.  He will think about this.   With labs remind about a colonoscopy reviewed paper chart and I do not see a record of this  Signed Lamar Blinks, MD

## 2013-12-29 NOTE — Patient Instructions (Signed)
Try taking the furosemide one a day as needed.  Use this just as needed for swelling   I will change your xanax to the 0.5 mg strength.  I will call the pharmacy to make this change I will be in touch with your labs  It does sound that you are trying to do more than is possible for you.  Please think about making some changes to your life- a new job, or moving closer to your job- to makes things manageable for you.

## 2013-12-30 ENCOUNTER — Encounter: Payer: Self-pay | Admitting: Family Medicine

## 2013-12-30 LAB — PSA: PSA: 0.58 ng/mL (ref <=4.00)

## 2014-03-02 ENCOUNTER — Ambulatory Visit (INDEPENDENT_AMBULATORY_CARE_PROVIDER_SITE_OTHER): Payer: 59 | Admitting: Internal Medicine

## 2014-03-02 VITALS — BP 126/76 | HR 72 | Temp 98.2°F | Resp 16 | Ht 68.0 in | Wt 223.8 lb

## 2014-03-02 DIAGNOSIS — I10 Essential (primary) hypertension: Secondary | ICD-10-CM

## 2014-03-02 DIAGNOSIS — G4700 Insomnia, unspecified: Secondary | ICD-10-CM

## 2014-03-02 DIAGNOSIS — B182 Chronic viral hepatitis C: Secondary | ICD-10-CM

## 2014-03-02 DIAGNOSIS — M25519 Pain in unspecified shoulder: Secondary | ICD-10-CM

## 2014-03-02 DIAGNOSIS — Z7189 Other specified counseling: Secondary | ICD-10-CM

## 2014-03-02 DIAGNOSIS — Z818 Family history of other mental and behavioral disorders: Secondary | ICD-10-CM

## 2014-03-02 DIAGNOSIS — M25512 Pain in left shoulder: Secondary | ICD-10-CM

## 2014-03-02 DIAGNOSIS — G894 Chronic pain syndrome: Secondary | ICD-10-CM

## 2014-03-02 DIAGNOSIS — R7989 Other specified abnormal findings of blood chemistry: Secondary | ICD-10-CM

## 2014-03-02 DIAGNOSIS — M542 Cervicalgia: Secondary | ICD-10-CM

## 2014-03-02 DIAGNOSIS — R945 Abnormal results of liver function studies: Secondary | ICD-10-CM

## 2014-03-02 DIAGNOSIS — G8929 Other chronic pain: Secondary | ICD-10-CM

## 2014-03-02 MED ORDER — HYDROCODONE-IBUPROFEN 7.5-200 MG PO TABS: 1.0000 | ORAL_TABLET | Freq: Three times a day (TID) | ORAL | Status: DC | PRN

## 2014-03-02 NOTE — Progress Notes (Signed)
   Subjective:    Patient ID: Tanner Webb, male    DOB: 07-Sep-1950, 63 y.o.   MRN: [id number]  HPI    Review of Systems     Objective:   Physical Exam        Assessment & Plan:

## 2014-03-02 NOTE — Progress Notes (Signed)
   Subjective:    Patient ID: Tanner Webb, male    DOB: [birth year], 63 y.o.   MRN: [id number]  HPI 63 year old male here for a refill for Norvasc and Vico profen. He is doing well with no other complaints. Not sure if he is due for Xanax refill.  Lots of family stress, mother has alzheimers and living at home with him. He has hepatitis c active followed by [facility]. HTN controlled Elavil caused side affects and he stopped. Immunizations reviewed, no record of Hep B series  Review of Systems     Objective:   Physical Exam  Constitutional: He is oriented to person, place, and time. He appears well-developed and well-nourished. No distress.  HENT:  Head: Normocephalic.  Eyes: EOM are normal. Pupils are equal, round, and reactive to light.  Neck: Normal range of motion.  Cardiovascular: Normal rate, regular rhythm and normal heart sounds.   Pulmonary/Chest: Breath sounds normal.  Neurological: He is alert and oriented to person, place, and time. He exhibits normal muscle tone. Coordination normal.  Psychiatric: He has a normal mood and affect. His behavior is normal. Thought content normal.          Assessment & Plan:  Hep B antibody titer RF vicoprofen

## 2014-03-02 NOTE — Patient Instructions (Signed)
Hepatitis C Hepatitis C is a viral infection of the liver. Infection may go undetected for months or years because symptoms may be absent or very mild. Chronic liver disease is the main danger of hepatitis C. This may lead to scarring of the liver (cirrhosis), liver failure, and liver cancer. CAUSES  Hepatitis C is caused by the hepatitis C virus (HCV). Formerly, hepatitis C infections were most commonly transmitted through blood transfusions. In the early 1990s, routine testing of donated blood for hepatitis C and exclusion of blood that tests positive for HCV began. Now, HCV is most commonly transmitted from person to person through injection drug use, sharing needles, or sex with an infected person. A caregiver may also get the infection from exposure to the blood of an infected patient by way of a cut or needle stick.  SYMPTOMS  Acute Phase Many cases of acute HCV infection are mild and cause few problems.Some people may not even realize they are sick.Symptoms in others may last a few weeks to several months and include:  Feeling very tired.  Loss of appetite.  Nausea.  Vomiting.  Abdominal pain.  Dark yellow urine.  Yellow skin and eyes (jaundice).  Itching of the skin. Chronic Phase  Between 50% to 85% of people who get HCV infection become "chronic carriers." They often have no symptoms, but the virus stays in their body.They may spread the virus to others and can get long-term liver disease.  Many people with chronic HCV infection remain healthy for many years. However, up to 1 in 5 chronically infected people may develop severe liver diseases including scarring of the liver (cirrhosis), liver failure, or liver cancer. DIAGNOSIS  Diagnosis of hepatitis C infection is made by testing blood for the presence of hepatitis C viral particles called RNA. Other tests may also be done to measure the status of current liver function, exclude other liver problems, or assess liver  damage. TREATMENT  Treatment with many antiviral drugs is available and recommended for some patients with chronic HCV infection. Drug treatment is generally considered appropriate for patients who:  Are 65 years of age or older.  Have a positive test for HCV particles in the blood.  Have a liver tissue sample (biopsy) that shows chronic hepatitis and significant scarring (fibrosis).  Do not have signs of liver failure.  Have acceptable blood test results that confirm the wellness of other body organs.  Are willing to be treated and conform to treatment requirements.  Have no other circumstances that would prevent treatment from being recommended (contraindications). All people who are offered and choose to receive drug treatment must understand that careful medical follow up for many months and even years is crucial in order to make successful care possible. The goal of drug treatment is to eliminate any evidence of HCV in the blood on a long-term basis. This is called a "sustained virologic response" or SVR. Achieving a SVR is associated with a decrease in the chance of life-threatening liver problems, need for a liver transplant, liver cancer rates, and liver-related complications. Successful treatment currently requires taking treatment drugs for at least 24 weeks and up to 72 weeks. An injected drug (interferon) given weekly and an oral antiviral medicine taken daily are usually prescribed. Side effects from these drugs are common and some may be very serious. Your response to treatment must be carefully monitored by both you and your caregiver throughout the entire treatment period. PREVENTION There is no vaccine for hepatitis C. The only  way to prevent the disease is to reduce the risk of exposure to the virus.   Avoid sharing drug needles or personal items like toothbrushes, razors, and nail clippers with an infected person.  Healthcare workers need to avoid injuries and wear  appropriate protective equipment such as gloves, gowns, and face masks when performing invasive medical or nursing procedures. HOME CARE INSTRUCTIONS  To avoid making your liver disease worse:  Strictly avoid drinking alcohol.  Carefully review all new prescriptions of medicines with your caregiver. Ask your caregiver which drugs you should avoid. The following drugs are toxic to the liver, and your caregiver may tell you to avoid them:  Isoniazid.  Methyldopa.  Acetaminophen.  Anabolic steroids (muscle-building drugs).  Erythromycin.  Oral contraceptives (birth control pills).  Check with your caregiver to make sure medicine you are currently taking will not be harmful.  Periodic blood tests may be required. Follow your caregiver's advice about when you should have blood tests.  Avoid a sexual relationship until advised otherwise by your caregiver.  Avoid activities that could expose other people to your blood. Examples include sharing a toothbrush, nail clippers, razors, and needles.  Bed rest is not necessary, but it may make you feel better. Recovery time is not related to the amount of rest you receive.  This infection is contagious. Follow your caregiver's instructions in order to avoid spread of the infection. SEEK IMMEDIATE MEDICAL CARE IF:  You have increasing fatigue or weakness.  You have an oral temperature above 102 F (38.9 C), not controlled by medicine.  You develop loss of appetite, nausea, or vomiting.  You develop jaundice.  You develop easy bruising or bleeding.  You develop any severe problems as a result of your treatment. MAKE SURE YOU:   Understand these instructions.  Will watch your condition.  Will get help right away if you are not doing well or get worse. Document Released: 07/28/2000 Document Revised: 10/23/2011 Document Reviewed: 11/30/2010 [facility] Patient Information 2015 [place], [place]. This information is not intended to replace  advice given to you by your health care provider. Make sure you discuss any questions you have with your health care provider. Insomnia Insomnia is frequent trouble falling and/or staying asleep. Insomnia can be a long term problem or a short term problem. Both are common. Insomnia can be a short term problem when the wakefulness is related to a certain stress or worry. Long term insomnia is often related to ongoing stress during waking hours and/or poor sleeping habits. Overtime, sleep deprivation itself can make the problem worse. Every little thing feels more severe because you are overtired and your ability to cope is decreased. CAUSES   Stress, anxiety, and depression.  Poor sleeping habits.  Distractions such as TV in the bedroom.  Naps close to bedtime.  Engaging in emotionally charged conversations before bed.  Technical reading before sleep.  Alcohol and other sedatives. They may make the problem worse. They can hurt normal sleep patterns and normal dream activity.  Stimulants such as caffeine for several hours prior to bedtime.  Pain syndromes and shortness of breath can cause insomnia.  Exercise late at night.  Changing time zones may cause sleeping problems (jet lag). It is sometimes helpful to have someone observe your sleeping patterns. They should look for periods of not breathing during the night (sleep apnea). They should also look to see how long those periods last. If you live alone or observers are uncertain, you can also be observed at  a sleep clinic where your sleep patterns will be professionally monitored. Sleep apnea requires a checkup and treatment. Give your caregivers your medical history. Give your caregivers observations your family has made about your sleep.  SYMPTOMS   Not feeling rested in the morning.  Anxiety and restlessness at bedtime.  Difficulty falling and staying asleep. TREATMENT   Your caregiver may prescribe treatment for an underlying  medical disorders. Your caregiver can give advice or help if you are using alcohol or other drugs for self-medication. Treatment of underlying problems will usually eliminate insomnia problems.  Medications can be prescribed for short time use. They are generally not recommended for lengthy use.  Over-the-counter sleep medicines are not recommended for lengthy use. They can be habit forming.  You can promote easier sleeping by making lifestyle changes such as:  Using relaxation techniques that help with breathing and reduce muscle tension.  Exercising earlier in the day.  Changing your diet and the time of your last meal. No night time snacks.  Establish a regular time to go to bed.  Counseling can help with stressful problems and worry.  Soothing music and white noise may be helpful if there are background noises you cannot remove.  Stop tedious detailed work at least one hour before bedtime. HOME CARE INSTRUCTIONS   Keep a diary. Inform your caregiver about your progress. This includes any medication side effects. See your caregiver regularly. Take note of:  Times when you are asleep.  Times when you are awake during the night.  The quality of your sleep.  How you feel the next day. This information will help your caregiver care for you.  Get out of bed if you are still awake after 15 minutes. Read or do some quiet activity. Keep the lights down. Wait until you feel sleepy and go back to bed.  Keep regular sleeping and waking hours. Avoid naps.  Exercise regularly.  Avoid distractions at bedtime. Distractions include watching television or engaging in any intense or detailed activity like attempting to balance the household checkbook.  Develop a bedtime ritual. Keep a familiar routine of bathing, brushing your teeth, climbing into bed at the same time each night, listening to soothing music. Routines increase the success of falling to sleep faster.  Use relaxation  techniques. This can be using breathing and muscle tension release routines. It can also include visualizing peaceful scenes. You can also help control troubling or intruding thoughts by keeping your mind occupied with boring or repetitive thoughts like the old concept of counting sheep. You can make it more creative like imagining planting one beautiful flower after another in your backyard garden.  During your day, work to eliminate stress. When this is not possible use some of the previous suggestions to help reduce the anxiety that accompanies stressful situations. MAKE SURE YOU:   Understand these instructions.  Will watch your condition.  Will get help right away if you are not doing well or get worse. Document Released: 07/28/2000 Document Revised: 10/23/2011 Document Reviewed: 08/28/2007 [facility] Patient Information 2015 [place], [place]. This information is not intended to replace advice given to you by your health care provider. Make sure you discuss any questions you have with your health care provider.

## 2014-03-03 LAB — HEPATITIS B SURFACE ANTIBODY, QUANTITATIVE: HEPATITIS B-POST: 0.4 m[IU]/mL

## 2014-06-08 ENCOUNTER — Ambulatory Visit (INDEPENDENT_AMBULATORY_CARE_PROVIDER_SITE_OTHER): Payer: 59 | Admitting: Internal Medicine

## 2014-06-08 VITALS — BP 138/86 | HR 67 | Temp 97.6°F | Resp 18 | Ht 68.0 in | Wt 206.0 lb

## 2014-06-08 DIAGNOSIS — M542 Cervicalgia: Secondary | ICD-10-CM

## 2014-06-08 DIAGNOSIS — Z818 Family history of other mental and behavioral disorders: Secondary | ICD-10-CM

## 2014-06-08 DIAGNOSIS — G8929 Other chronic pain: Secondary | ICD-10-CM

## 2014-06-08 DIAGNOSIS — G4700 Insomnia, unspecified: Secondary | ICD-10-CM

## 2014-06-08 DIAGNOSIS — Z1211 Encounter for screening for malignant neoplasm of colon: Secondary | ICD-10-CM

## 2014-06-08 DIAGNOSIS — F17200 Nicotine dependence, unspecified, uncomplicated: Secondary | ICD-10-CM | POA: Insufficient documentation

## 2014-06-08 DIAGNOSIS — Z23 Encounter for immunization: Secondary | ICD-10-CM

## 2014-06-08 DIAGNOSIS — Z7189 Other specified counseling: Secondary | ICD-10-CM

## 2014-06-08 DIAGNOSIS — Z7185 Encounter for immunization safety counseling: Secondary | ICD-10-CM

## 2014-06-08 DIAGNOSIS — B1920 Unspecified viral hepatitis C without hepatic coma: Secondary | ICD-10-CM

## 2014-06-08 DIAGNOSIS — B182 Chronic viral hepatitis C: Secondary | ICD-10-CM

## 2014-06-08 DIAGNOSIS — Z720 Tobacco use: Secondary | ICD-10-CM

## 2014-06-08 DIAGNOSIS — M25512 Pain in left shoulder: Secondary | ICD-10-CM

## 2014-06-08 LAB — HEPATITIS B SURFACE ANTIBODY, QUANTITATIVE: Hepatitis B-Post: 0.4 m[IU]/mL

## 2014-06-08 MED ORDER — HYDROCODONE-IBUPROFEN 7.5-200 MG PO TABS: 1.0000 | ORAL_TABLET | Freq: Three times a day (TID) | ORAL | Status: DC | PRN

## 2014-06-08 MED ORDER — METHOCARBAMOL 750 MG PO TABS: 750.0000 mg | ORAL_TABLET | Freq: Four times a day (QID) | ORAL | Status: DC

## 2014-06-08 NOTE — Patient Instructions (Addendum)
Stress Stress-related medical problems are becoming increasingly common. The body has a built-in physical response to stressful situations. Faced with pressure, challenge or danger, we need to react quickly. Our bodies release hormones such as cortisol and adrenaline to help do this. These hormones are part of the "fight or flight" response and affect the metabolic rate, heart rate and blood pressure, resulting in a heightened, stressed state that prepares the body for optimum performance in dealing with a stressful situation. It is likely that early man required these mechanisms to stay alive, but usually modern stresses do not call for this, and the same hormones released in today's world can damage health and reduce coping ability. CAUSES  Pressure to perform at work, at school or in sports.  Threats of physical violence.  Money worries.  Arguments.  Family conflicts.  Divorce or separation from significant other.  Bereavement.  New job or unemployment.  Changes in location.  Alcohol or drug abuse. SOMETIMES, THERE IS NO PARTICULAR REASON FOR DEVELOPING STRESS. Almost all people are at risk of being stressed at some time in their lives. It is important to know that some stress is temporary and some is long term.  Temporary stress will go away when a situation is resolved. Most people can cope with short periods of stress, and it can often be relieved by relaxing, taking a walk or getting any type of exercise, chatting through issues with friends, or having a good night's sleep.  Chronic (long-term, continuous) stress is much harder to deal with. It can be psychologically and emotionally damaging. It can be harmful both for an individual and for friends and family. SYMPTOMS Everyone reacts to stress differently. There are some common effects that help us recognize it. In times of extreme stress, people may:  Shake uncontrollably.  Breathe faster and deeper than normal  (hyperventilate).  Vomit.  For people with asthma, stress can trigger an attack.  For some people, stress may trigger migraine headaches, ulcers, and body pain. PHYSICAL EFFECTS OF STRESS MAY INCLUDE:  Loss of energy.  Skin problems.  Aches and pains resulting from tense muscles, including neck ache, backache and tension headaches.  Increased pain from arthritis and other conditions.  Irregular heart beat (palpitations).  Periods of irritability or anger.  Apathy or depression.  Anxiety (feeling uptight or worrying).  Unusual behavior.  Loss of appetite.  Comfort eating.  Lack of concentration.  Loss of, or decreased, sex-drive.  Increased smoking, drinking, or recreational drug use.  For women, missed periods.  Ulcers, joint pain, and muscle pain. Post-traumatic stress is the stress caused by any serious accident, strong emotional damage, or extremely difficult or violent experience such as rape or war. Post-traumatic stress victims can experience mixtures of emotions such as fear, shame, depression, guilt or anger. It may include recurrent memories or images that may be haunting. These feelings can last for weeks, months or even years after the traumatic event that triggered them. Specialized treatment, possibly with medicines and psychological therapies, is available. If stress is causing physical symptoms, severe distress or making it difficult for you to function as normal, it is worth seeing your caregiver. It is important to remember that although stress is a usual part of life, extreme or prolonged stress can lead to other illnesses that will need treatment. It is better to visit a doctor sooner rather than later. Stress has been linked to the development of high blood pressure and heart disease, as well as insomnia and depression.   There is no diagnostic test for stress since everyone reacts to it differently. But a caregiver will be able to spot the physical  symptoms, such as:  Headaches.  Shingles.  Ulcers. Emotional distress such as intense worry, low mood or irritability should be detected when the doctor asks pertinent questions to identify any underlying problems that might be the cause. In case there are physical reasons for the symptoms, the doctor may also want to do some tests to exclude certain conditions. If you feel that you are suffering from stress, try to identify the aspects of your life that are causing it. Sometimes you may not be able to change or avoid them, but even a small change can have a positive ripple effect. A simple lifestyle change can make all the difference. STRATEGIES THAT CAN HELP DEAL WITH STRESS:  Delegating or sharing responsibilities.  Avoiding confrontations.  Learning to be more assertive.  Regular exercise.  Avoid using alcohol or street drugs to cope.  Eating a healthy, balanced diet, rich in fruit and vegetables and proteins.  Finding humor or absurdity in stressful situations.  Never taking on more than you know you can handle comfortably.  Organizing your time better to get as much done as possible.  Talking to friends or family and sharing your thoughts and fears.  Listening to music or relaxation tapes.  Relaxation techniques like deep breathing, meditation, and yoga.  Tensing and then relaxing your muscles, starting at the toes and working up to the head and neck. If you think that you would benefit from help, either in identifying the things that are causing your stress or in learning techniques to help you relax, see a caregiver who is capable of helping you with this. Rather than relying on medications, it is usually better to try and identify the things in your life that are causing stress and try to deal with them. There are many techniques of managing stress including counseling, psychotherapy, aromatherapy, yoga, and exercise. Your caregiver can help you determine what is best  for you. Document Released: 10/21/2002 Document Revised: 08/05/2013 Document Reviewed: 09/17/2007 [facility] Patient Information 2015 [place], [place]. This information is not intended to replace advice given to you by your health care provider. Make sure you discuss any questions you have with your health care provider. Chronic Pain Chronic pain can be defined as pain that is off and on and lasts for 3-6 months or longer. Many things cause chronic pain, which can make it difficult to make a diagnosis. There are many treatment options available for chronic pain. However, finding a treatment that works well for you may require trying various approaches until the right one is found. Many people benefit from a combination of two or more types of treatment to control their pain. SYMPTOMS  Chronic pain can occur anywhere in the body and can range from mild to very severe. Some types of chronic pain include:  Headache.  Low back pain.  Cancer pain.  Arthritis pain.  Neurogenic pain. This is pain resulting from damage to nerves. People with chronic pain may also have other symptoms such as:  Depression.  Anger.  Insomnia.  Anxiety. DIAGNOSIS  Your health care provider will help diagnose your condition over time. In many cases, the initial focus will be on excluding possible conditions that could be causing the pain. Depending on your symptoms, your health care provider may order tests to diagnose your condition. Some of these tests may include:   Blood tests.  CT scan.   MRI.   X-rays.   Ultrasounds.   Nerve conduction studies.  You may need to see a specialist.  TREATMENT  Finding treatment that works well may take time. You may be referred to a pain specialist. He or she may prescribe medicine or therapies, such as:   Mindful meditation or yoga.  Shots (injections) of numbing or pain-relieving medicines into the spine or area of pain.  Local electrical  stimulation.  Acupuncture.   Massage therapy.   Aroma, color, light, or sound therapy.   Biofeedback.   Working with a physical therapist to keep from getting stiff.   Regular, gentle exercise.   Cognitive or behavioral therapy.   Group support.  Sometimes, surgery may be recommended.  HOME CARE INSTRUCTIONS   Take all medicines as directed by your health care provider.   Lessen stress in your life by relaxing and doing things such as listening to calming music.   Exercise or be active as directed by your health care provider.   Eat a healthy diet and include things such as vegetables, fruits, fish, and lean meats in your diet.   Keep all follow-up appointments with your health care provider.   Attend a support group with others suffering from chronic pain. SEEK MEDICAL CARE IF:   Your pain gets worse.   You develop a new pain that was not there before.   You cannot tolerate medicines given to you by your health care provider.   You have new symptoms since your last visit with your health care provider.  SEEK IMMEDIATE MEDICAL CARE IF:   You feel weak.   You have decreased sensation or numbness.   You lose control of bowel or bladder function.   Your pain suddenly gets much worse.   You develop shaking.  You develop chills.  You develop confusion.  You develop chest pain.  You develop shortness of breath.  MAKE SURE YOU:  Understand these instructions.  Will watch your condition.  Will get help right away if you are not doing well or get worse. Document Released: 04/22/2002 Document Revised: 04/02/2013 Document Reviewed: 01/24/2013 [facility] Patient Information 2015 [place], [place]. This information is not intended to replace advice given to you by your health care provider. Make sure you discuss any questions you have with your health care provider. Smoking Cessation Quitting smoking is important to your health and has many  advantages. However, it is not always easy to quit since nicotine is a very addictive drug. Oftentimes, people try 3 times or more before being able to quit. This document explains the best ways for you to prepare to quit smoking. Quitting takes hard work and a lot of effort, but you can do it. ADVANTAGES OF QUITTING SMOKING  You will live longer, feel better, and live better.  Your body will feel the impact of quitting smoking almost immediately.  Within 20 minutes, blood pressure decreases. Your pulse returns to its normal level.  After 8 hours, carbon monoxide levels in the blood return to normal. Your oxygen level increases.  After 24 hours, the chance of having a heart attack starts to decrease. Your breath, hair, and body stop smelling like smoke.  After 48 hours, damaged nerve endings begin to recover. Your sense of taste and smell improve.  After 72 hours, the body is virtually free of nicotine. Your bronchial tubes relax and breathing becomes easier.  After 2 to 12 weeks, lungs can hold more air. Exercise becomes  easier and circulation improves.  The risk of having a heart attack, stroke, cancer, or lung disease is greatly reduced.  After 1 year, the risk of coronary heart disease is cut in half.  After 5 years, the risk of stroke falls to the same as a nonsmoker.  After 10 years, the risk of lung cancer is cut in half and the risk of other cancers decreases significantly.  After 15 years, the risk of coronary heart disease drops, usually to the level of a nonsmoker.  If you are pregnant, quitting smoking will improve your chances of having a healthy baby.  The people you live with, especially any children, will be healthier.  You will have extra money to spend on things other than cigarettes. QUESTIONS TO THINK ABOUT BEFORE ATTEMPTING TO QUIT You may want to talk about your answers with your health care provider.  Why do you want to quit?  If you tried to quit in the  past, what helped and what did not?  What will be the most difficult situations for you after you quit? How will you plan to handle them?  Who can help you through the tough times? Your family? Friends? A health care provider?  What pleasures do you get from smoking? What ways can you still get pleasure if you quit? Here are some questions to ask your health care provider:  How can you help me to be successful at quitting?  What medicine do you think would be best for me and how should I take it?  What should I do if I need more help?  What is smoking withdrawal like? How can I get information on withdrawal? GET READY  Set a quit date.  Change your environment by getting rid of all cigarettes, ashtrays, matches, and lighters in your home, car, or work. Do not let people smoke in your home.  Review your past attempts to quit. Think about what worked and what did not. GET SUPPORT AND ENCOURAGEMENT You have a better chance of being successful if you have help. You can get support in many ways.  Tell your family, friends, and coworkers that you are going to quit and need their support. Ask them not to smoke around you.  Get individual, group, or telephone counseling and support. Programs are available at [employer] and health centers. Call your local health department for information about programs in your area.  Spiritual beliefs and practices may help some smokers quit.  Download a "quit meter" on your computer to keep track of quit statistics, such as how long you have gone without smoking, cigarettes not smoked, and money saved.  Get a self-help book about quitting smoking and staying off tobacco. [place] yourself from urges to smoke. Talk to someone, go for a walk, or occupy your time with a task.  Change your normal routine. Take a different route to work. Drink tea instead of coffee. Eat breakfast in a different place.  Reduce your  stress. Take a hot bath, exercise, or read a book.  Plan something enjoyable to do every day. Reward yourself for not smoking.  Explore interactive web-based programs that specialize in helping you quit. GET MEDICINE AND USE IT CORRECTLY Medicines can help you stop smoking and decrease the urge to smoke. Combining medicine with the above behavioral methods and support can greatly increase your chances of successfully quitting smoking.  Nicotine replacement therapy helps deliver nicotine to your body without the  negative effects and risks of smoking. Nicotine replacement therapy includes nicotine gum, lozenges, inhalers, nasal sprays, and skin patches. Some may be available over-the-counter and others require a prescription.  Antidepressant medicine helps people abstain from smoking, but how this works is unknown. This medicine is available by prescription.  Nicotinic receptor partial agonist medicine simulates the effect of nicotine in your brain. This medicine is available by prescription. Ask your health care provider for advice about which medicines to use and how to use them based on your health history. Your health care provider will tell you what side effects to look out for if you choose to be on a medicine or therapy. Carefully read the information on the package. Do not use any other product containing nicotine while using a nicotine replacement product.  RELAPSE OR DIFFICULT SITUATIONS Most relapses occur within the first 3 months after quitting. Do not be discouraged if you start smoking again. Remember, most people try several times before finally quitting. You may have symptoms of withdrawal because your body is used to nicotine. You may crave cigarettes, be irritable, feel very hungry, cough often, get headaches, or have difficulty concentrating. The withdrawal symptoms are only temporary. They are strongest when you first quit, but they will go away within 10-14 days. To reduce the  chances of relapse, try to:  Avoid drinking alcohol. Drinking lowers your chances of successfully quitting.  Reduce the amount of caffeine you consume. Once you quit smoking, the amount of caffeine in your body increases and can give you symptoms, such as a rapid heartbeat, sweating, and anxiety.  Avoid smokers because they can make you want to smoke.  Do not let weight gain distract you. Many smokers will gain weight when they quit, usually less than 10 pounds. Eat a healthy diet and stay active. You can always lose the weight gained after you quit.  Find ways to improve your mood other than smoking. FOR MORE INFORMATION  www.smokefree.gov  Document Released: 07/25/2001 Document Revised: 12/15/2013 Document Reviewed: 11/09/2011 [facility] Patient Information 2015 [place], [place]. This information is not intended to replace advice given to you by your health care provider. Make sure you discuss any questions you have with your health care provider.   Get colonoscopy as ordered

## 2014-06-08 NOTE — Progress Notes (Signed)
   Subjective:    Patient ID: Tanner Webb, male    DOB: [birth year], 63 y.o.   MRN: [id number]  HPI    Review of Systems     Objective:   Physical Exam        Assessment & Plan:

## 2014-06-08 NOTE — Progress Notes (Signed)
   Subjective:    Patient ID: Tanner Webb, male    DOB: 16-Dec-1950, 63 y.o.   MRN: [id number]  HPI Pt is here today requesting a medication refill for Vicoprofen. He states his left shoulder has been bothering recently especially with increased activity and lifting. He states OTC medications do not help this pain and Vicoprofen helps quite a bit.   He is also requesting a flu vaccine and a Hepatitis B vaccine. Pt has been instructed by one of his physicians to get updated on Hep B. He has a history of Hepatitis C.   His last physical exam was in May of 2015. His mother lives with him and has alzheimers. Dr. Lorelei Pont is his new MD.  Hepatitis C is active and new med soon to be started. Is smoking again. Review of Systems     Objective:   Physical Exam  Vitals reviewed. Constitutional: He is oriented to person, place, and time. He appears well-developed and well-nourished. He is cooperative. No distress.  Smells like cigarrette smoker.  HENT:  Head: Normocephalic.  Eyes: EOM are normal.  Neck: Normal range of motion.  Cardiovascular: Normal rate, regular rhythm and normal heart sounds.   Pulmonary/Chest: Effort normal and breath sounds normal.  Musculoskeletal: He exhibits tenderness.  Neurological: He is alert and oriented to person, place, and time. He exhibits normal muscle tone. Coordination normal.  Psychiatric: He has a normal mood and affect. His behavior is normal. Judgment and thought content normal.          Assessment & Plan:  Dr. Lorelei Pont his primary care now Flu and Hep B vaccine given to day Will refill vicoprofen one more time.

## 2014-06-10 ENCOUNTER — Emergency Department (HOSPITAL_COMMUNITY): Payer: 59 | Admitting: Anesthesiology

## 2014-06-10 ENCOUNTER — Encounter (HOSPITAL_COMMUNITY): Admission: EM | Disposition: A | Discharge status: Home / Self Care | Payer: Self-pay | Source: Home / Self Care

## 2014-06-10 ENCOUNTER — Inpatient Hospital Stay (HOSPITAL_COMMUNITY)
Admission: EM | Admit: 2014-06-10 | Discharge: 2014-06-14 | DRG: 354 | Disposition: A | Discharge status: Home / Self Care | Payer: 59 | Attending: Surgery | Admitting: Surgery

## 2014-06-10 ENCOUNTER — Encounter (HOSPITAL_COMMUNITY): Payer: 59 | Admitting: Anesthesiology

## 2014-06-10 ENCOUNTER — Emergency Department (HOSPITAL_COMMUNITY): Payer: 59

## 2014-06-10 ENCOUNTER — Encounter (HOSPITAL_COMMUNITY): Payer: Self-pay | Admitting: Emergency Medicine

## 2014-06-10 DIAGNOSIS — K5660 Unspecified intestinal obstruction: Secondary | ICD-10-CM | POA: Diagnosis present

## 2014-06-10 DIAGNOSIS — B1920 Unspecified viral hepatitis C without hepatic coma: Secondary | ICD-10-CM | POA: Diagnosis present

## 2014-06-10 DIAGNOSIS — Z882 Allergy status to sulfonamides status: Secondary | ICD-10-CM | POA: Diagnosis not present

## 2014-06-10 DIAGNOSIS — Z01818 Encounter for other preprocedural examination: Secondary | ICD-10-CM

## 2014-06-10 DIAGNOSIS — K439 Ventral hernia without obstruction or gangrene: Secondary | ICD-10-CM

## 2014-06-10 DIAGNOSIS — Z79899 Other long term (current) drug therapy: Secondary | ICD-10-CM | POA: Diagnosis not present

## 2014-06-10 DIAGNOSIS — K436 Other and unspecified ventral hernia with obstruction, without gangrene: Secondary | ICD-10-CM | POA: Diagnosis not present

## 2014-06-10 DIAGNOSIS — Z880 Allergy status to penicillin: Secondary | ICD-10-CM | POA: Diagnosis not present

## 2014-06-10 DIAGNOSIS — I10 Essential (primary) hypertension: Secondary | ICD-10-CM | POA: Diagnosis present

## 2014-06-10 DIAGNOSIS — R0602 Shortness of breath: Secondary | ICD-10-CM

## 2014-06-10 DIAGNOSIS — B182 Chronic viral hepatitis C: Secondary | ICD-10-CM | POA: Diagnosis present

## 2014-06-10 DIAGNOSIS — E785 Hyperlipidemia, unspecified: Secondary | ICD-10-CM | POA: Diagnosis present

## 2014-06-10 DIAGNOSIS — G8918 Other acute postprocedural pain: Secondary | ICD-10-CM

## 2014-06-10 DIAGNOSIS — R109 Unspecified abdominal pain: Secondary | ICD-10-CM | POA: Diagnosis not present

## 2014-06-10 DIAGNOSIS — Z87891 Personal history of nicotine dependence: Secondary | ICD-10-CM

## 2014-06-10 HISTORY — PX: VENTRAL HERNIA REPAIR: SHX424

## 2014-06-10 HISTORY — PX: LAPAROTOMY: SHX154

## 2014-06-10 LAB — CBC WITH DIFFERENTIAL/PLATELET
Basophils Absolute: 0.1 10*3/uL (ref 0.0–0.1)
Basophils Relative: 0 % (ref 0–1)
Eosinophils Absolute: 0.1 10*3/uL (ref 0.0–0.7)
Eosinophils Relative: 1 % (ref 0–5)
HEMATOCRIT: 44 % (ref 39.0–52.0)
HEMOGLOBIN: 15.6 g/dL (ref 13.0–17.0)
LYMPHS ABS: 2.7 10*3/uL (ref 0.7–4.0)
Lymphocytes Relative: 23 % (ref 12–46)
MCH: 32.4 pg (ref 26.0–34.0)
MCHC: 35.5 g/dL (ref 30.0–36.0)
MCV: 91.3 fL (ref 78.0–100.0)
MONO ABS: 1 10*3/uL (ref 0.1–1.0)
Monocytes Relative: 9 % (ref 3–12)
NEUTROS PCT: 67 % (ref 43–77)
Neutro Abs: 8 10*3/uL — ABNORMAL HIGH (ref 1.7–7.7)
Platelets: 360 10*3/uL (ref 150–400)
RBC: 4.82 MIL/uL (ref 4.22–5.81)
RDW: 13.2 % (ref 11.5–15.5)
WBC: 11.9 10*3/uL — ABNORMAL HIGH (ref 4.0–10.5)

## 2014-06-10 LAB — CREATININE, SERUM
CREATININE: 0.58 mg/dL (ref 0.50–1.35)
GFR calc Af Amer: >90 mL/min (ref >90)
GFR calc non Af Amer: >90 mL/min (ref >90)

## 2014-06-10 LAB — COMPREHENSIVE METABOLIC PANEL
ALK PHOS: 82 U/L (ref 39–117)
ALT: 68 U/L — AB (ref 0–53)
AST: 47 U/L — ABNORMAL HIGH (ref 0–37)
Albumin: 3.7 g/dL (ref 3.5–5.2)
Anion gap: 16 — ABNORMAL HIGH (ref 5–15)
BUN: 7 mg/dL (ref 6–23)
CO2: 26 mEq/L (ref 19–32)
Calcium: 9.2 mg/dL (ref 8.4–10.5)
Chloride: 97 mEq/L (ref 96–112)
Creatinine, Ser: 0.69 mg/dL (ref 0.50–1.35)
GFR calc non Af Amer: >90 mL/min (ref >90)
GLUCOSE: 133 mg/dL — AB (ref 70–99)
POTASSIUM: 3.5 meq/L — AB (ref 3.7–5.3)
SODIUM: 139 meq/L (ref 137–147)
Total Bilirubin: 0.4 mg/dL (ref 0.3–1.2)
Total Protein: 6.5 g/dL (ref 6.0–8.3)

## 2014-06-10 LAB — I-STAT CG4 LACTIC ACID, ED: Lactic Acid, Venous: 2.01 mmol/L (ref 0.5–2.2)

## 2014-06-10 SURGERY — LAPAROTOMY, EXPLORATORY
Anesthesia: General | Site: Abdomen

## 2014-06-10 MED ORDER — HYDROMORPHONE HCL 1 MG/ML IJ SOLN
0.2500 mg | INTRAMUSCULAR | Status: DC | PRN
  Administered 2014-06-10: 0.5 mg via INTRAVENOUS

## 2014-06-10 MED ORDER — LIDOCAINE HCL (CARDIAC) 20 MG/ML IV SOLN
INTRAVENOUS | Status: DC | PRN
  Administered 2014-06-10: 100 mg via INTRAVENOUS

## 2014-06-10 MED ORDER — KCL IN DEXTROSE-NACL 20-5-0.45 MEQ/L-%-% IV SOLN
INTRAVENOUS | Status: AC
  Filled 2014-06-10: qty 1000

## 2014-06-10 MED ORDER — SODIUM CHLORIDE 0.9 % IJ SOLN: 9.0000 mL | INTRAMUSCULAR | Status: DC | PRN

## 2014-06-10 MED ORDER — ETOMIDATE 2 MG/ML IV SOLN
12.0000 mg | Freq: Once | INTRAVENOUS | Status: AC
  Administered 2014-06-10: 12 mg via INTRAVENOUS
  Filled 2014-06-10: qty 10

## 2014-06-10 MED ORDER — NALOXONE HCL 0.4 MG/ML IJ SOLN
0.4000 mg | INTRAMUSCULAR | Status: DC | PRN
  Filled 2014-06-10: qty 1

## 2014-06-10 MED ORDER — EPHEDRINE SULFATE 50 MG/ML IJ SOLN
INTRAMUSCULAR | Status: AC
  Filled 2014-06-10: qty 1

## 2014-06-10 MED ORDER — ONDANSETRON HCL 4 MG/2ML IJ SOLN
INTRAMUSCULAR | Status: DC | PRN
  Administered 2014-06-10: 4 mg via INTRAVENOUS

## 2014-06-10 MED ORDER — EPHEDRINE SULFATE 50 MG/ML IJ SOLN
INTRAMUSCULAR | Status: DC | PRN
  Administered 2014-06-10 (×3): 10 mg via INTRAVENOUS

## 2014-06-10 MED ORDER — FENTANYL CITRATE 0.05 MG/ML IJ SOLN
INTRAMUSCULAR | Status: AC
  Filled 2014-06-10: qty 5

## 2014-06-10 MED ORDER — SODIUM CHLORIDE 0.9 % IJ SOLN
INTRAMUSCULAR | Status: AC
  Filled 2014-06-10: qty 10

## 2014-06-10 MED ORDER — HYDROMORPHONE HCL 1 MG/ML IJ SOLN
INTRAMUSCULAR | Status: AC
  Filled 2014-06-10: qty 1

## 2014-06-10 MED ORDER — SODIUM CHLORIDE 0.9 % IV SOLN
INTRAVENOUS | Status: DC | PRN
  Administered 2014-06-10: 04:00:00 via INTRAVENOUS

## 2014-06-10 MED ORDER — ONDANSETRON HCL 4 MG/2ML IJ SOLN
4.0000 mg | Freq: Once | INTRAMUSCULAR | Status: AC
  Administered 2014-06-10: 4 mg via INTRAVENOUS

## 2014-06-10 MED ORDER — OXYCODONE HCL 5 MG PO TABS
ORAL_TABLET | ORAL | Status: AC
  Filled 2014-06-10: qty 1

## 2014-06-10 MED ORDER — HYDROMORPHONE HCL 1 MG/ML IJ SOLN
0.2500 mg | INTRAMUSCULAR | Status: DC | PRN
  Administered 2014-06-10 (×4): 0.5 mg via INTRAVENOUS

## 2014-06-10 MED ORDER — NEOSTIGMINE METHYLSULFATE 10 MG/10ML IV SOLN
INTRAVENOUS | Status: DC | PRN
  Administered 2014-06-10: 5 mg via INTRAVENOUS

## 2014-06-10 MED ORDER — PROPOFOL 10 MG/ML IV BOLUS
INTRAVENOUS | Status: AC
  Filled 2014-06-10: qty 20

## 2014-06-10 MED ORDER — HYDROMORPHONE HCL 1 MG/ML IJ SOLN
1.0000 mg | Freq: Once | INTRAMUSCULAR | Status: AC
  Administered 2014-06-10: 1 mg via INTRAVENOUS

## 2014-06-10 MED ORDER — ROCURONIUM BROMIDE 50 MG/5ML IV SOLN
INTRAVENOUS | Status: AC
  Filled 2014-06-10: qty 1

## 2014-06-10 MED ORDER — OXYCODONE HCL 5 MG/5ML PO SOLN: 5.0000 mg | Freq: Once | ORAL | Status: AC | PRN

## 2014-06-10 MED ORDER — ENOXAPARIN SODIUM 40 MG/0.4ML ~~LOC~~ SOLN
40.0000 mg | SUBCUTANEOUS | Status: DC
  Administered 2014-06-11 – 2014-06-14 (×4): 40 mg via SUBCUTANEOUS
  Filled 2014-06-10 (×5): qty 0.4

## 2014-06-10 MED ORDER — LACTATED RINGERS IV SOLN
INTRAVENOUS | Status: DC | PRN
  Administered 2014-06-10 (×3): via INTRAVENOUS

## 2014-06-10 MED ORDER — 0.9 % SODIUM CHLORIDE (POUR BTL) OPTIME
TOPICAL | Status: DC | PRN
  Administered 2014-06-10 (×2): 1000 mL

## 2014-06-10 MED ORDER — ONDANSETRON HCL 4 MG/2ML IJ SOLN
INTRAMUSCULAR | Status: AC
  Filled 2014-06-10: qty 2

## 2014-06-10 MED ORDER — HYDROMORPHONE HCL 1 MG/ML IJ SOLN
INTRAMUSCULAR | Status: AC
  Administered 2014-06-10: 1 mg via INTRAVENOUS
  Filled 2014-06-10: qty 1

## 2014-06-10 MED ORDER — PROPOFOL 10 MG/ML IV BOLUS
INTRAVENOUS | Status: DC | PRN
  Administered 2014-06-10: 120 mg via INTRAVENOUS

## 2014-06-10 MED ORDER — HYDROMORPHONE 0.3 MG/ML IV SOLN
INTRAVENOUS | Status: DC
  Administered 2014-06-10: 0.5 mg via INTRAVENOUS
  Administered 2014-06-10: 5.1 mg via INTRAVENOUS
  Administered 2014-06-10: 0.3 mg via INTRAVENOUS
  Administered 2014-06-11: 3 mg via INTRAVENOUS
  Administered 2014-06-11: 1.2 mg via INTRAVENOUS
  Administered 2014-06-11: 02:00:00 via INTRAVENOUS
  Filled 2014-06-10 (×2): qty 25

## 2014-06-10 MED ORDER — ALPRAZOLAM 0.5 MG PO TABS: 0.5000 mg | ORAL_TABLET | Freq: Every evening | ORAL | Status: DC | PRN

## 2014-06-10 MED ORDER — ONDANSETRON HCL 4 MG PO TABS: 4.0000 mg | ORAL_TABLET | Freq: Four times a day (QID) | ORAL | Status: DC | PRN

## 2014-06-10 MED ORDER — SUCCINYLCHOLINE CHLORIDE 20 MG/ML IJ SOLN
INTRAMUSCULAR | Status: DC | PRN
  Administered 2014-06-10: 150 mg via INTRAVENOUS

## 2014-06-10 MED ORDER — MIDAZOLAM HCL 2 MG/2ML IJ SOLN
INTRAMUSCULAR | Status: AC
  Filled 2014-06-10: qty 2

## 2014-06-10 MED ORDER — GLYCOPYRROLATE 0.2 MG/ML IJ SOLN
INTRAMUSCULAR | Status: AC
  Filled 2014-06-10: qty 4

## 2014-06-10 MED ORDER — OXYCODONE HCL 5 MG PO TABS
5.0000 mg | ORAL_TABLET | Freq: Once | ORAL | Status: AC | PRN
  Administered 2014-06-10: 5 mg via ORAL

## 2014-06-10 MED ORDER — ONDANSETRON HCL 4 MG/2ML IJ SOLN: 4.0000 mg | Freq: Four times a day (QID) | INTRAMUSCULAR | Status: DC | PRN

## 2014-06-10 MED ORDER — IOHEXOL 300 MG/ML  SOLN
50.0000 mL | Freq: Once | INTRAMUSCULAR | Status: AC | PRN
  Administered 2014-06-10: 50 mL via ORAL

## 2014-06-10 MED ORDER — VANCOMYCIN HCL IN DEXTROSE 1-5 GM/200ML-% IV SOLN
1000.0000 mg | Freq: Once | INTRAVENOUS | Status: AC
  Administered 2014-06-10: 1000 mg via INTRAVENOUS

## 2014-06-10 MED ORDER — LIDOCAINE HCL (CARDIAC) 20 MG/ML IV SOLN
INTRAVENOUS | Status: AC
  Filled 2014-06-10: qty 5

## 2014-06-10 MED ORDER — DIPHENHYDRAMINE HCL 12.5 MG/5ML PO ELIX
12.5000 mg | ORAL_SOLUTION | Freq: Four times a day (QID) | ORAL | Status: DC | PRN
  Filled 2014-06-10: qty 5

## 2014-06-10 MED ORDER — KCL IN DEXTROSE-NACL 20-5-0.45 MEQ/L-%-% IV SOLN
INTRAVENOUS | Status: DC
  Administered 2014-06-10 – 2014-06-11 (×4): via INTRAVENOUS
  Filled 2014-06-10 (×6): qty 1000

## 2014-06-10 MED ORDER — HYDROMORPHONE 0.3 MG/ML IV SOLN
INTRAVENOUS | Status: AC
  Administered 2014-06-10: 06:00:00
  Filled 2014-06-10: qty 25

## 2014-06-10 MED ORDER — GLYCOPYRROLATE 0.2 MG/ML IJ SOLN
INTRAMUSCULAR | Status: DC | PRN
  Administered 2014-06-10: .8 mg via INTRAVENOUS

## 2014-06-10 MED ORDER — FENTANYL CITRATE 0.05 MG/ML IJ SOLN
INTRAMUSCULAR | Status: DC | PRN
  Administered 2014-06-10: 50 ug via INTRAVENOUS
  Administered 2014-06-10: 100 ug via INTRAVENOUS
  Administered 2014-06-10: 50 ug via INTRAVENOUS
  Administered 2014-06-10: 150 ug via INTRAVENOUS
  Administered 2014-06-10 (×3): 50 ug via INTRAVENOUS

## 2014-06-10 MED ORDER — MEPERIDINE HCL 25 MG/ML IJ SOLN: 6.2500 mg | INTRAMUSCULAR | Status: DC | PRN

## 2014-06-10 MED ORDER — ROCURONIUM BROMIDE 100 MG/10ML IV SOLN
INTRAVENOUS | Status: DC | PRN
  Administered 2014-06-10 (×2): 20 mg via INTRAVENOUS
  Administered 2014-06-10: 30 mg via INTRAVENOUS
  Administered 2014-06-10: 20 mg via INTRAVENOUS

## 2014-06-10 MED ORDER — DIPHENHYDRAMINE HCL 50 MG/ML IJ SOLN
12.5000 mg | Freq: Four times a day (QID) | INTRAMUSCULAR | Status: DC | PRN
  Filled 2014-06-10: qty 0.25

## 2014-06-10 MED ORDER — SODIUM CHLORIDE 0.9 % IV SOLN
Freq: Once | INTRAVENOUS | Status: AC
  Administered 2014-06-10: 10 mL/h via INTRAVENOUS

## 2014-06-10 MED ORDER — SUCCINYLCHOLINE CHLORIDE 20 MG/ML IJ SOLN
INTRAMUSCULAR | Status: AC
  Filled 2014-06-10: qty 1

## 2014-06-10 MED ORDER — ONDANSETRON HCL 4 MG/2ML IJ SOLN: 4.0000 mg | Freq: Once | INTRAMUSCULAR | Status: DC | PRN

## 2014-06-10 MED ORDER — MIDAZOLAM HCL 2 MG/2ML IJ SOLN
INTRAMUSCULAR | Status: DC | PRN
  Administered 2014-06-10: 2 mg via INTRAVENOUS

## 2014-06-10 SURGICAL SUPPLY — 53 items
BLADE SURG ROTATE 9660 (MISCELLANEOUS) ×1 IMPLANT
CANISTER SUCTION 2500CC (MISCELLANEOUS) ×2 IMPLANT
CHLORAPREP W/TINT 26ML (MISCELLANEOUS) ×2 IMPLANT
COVER MAYO STAND STRL (DRAPES) IMPLANT
COVER SURGICAL LIGHT HANDLE (MISCELLANEOUS) ×2 IMPLANT
DRAIN CHANNEL 19F RND (DRAIN) ×1 IMPLANT
DRAPE LAPAROSCOPIC ABDOMINAL (DRAPES) ×2 IMPLANT
DRAPE PROXIMA HALF (DRAPES) IMPLANT
DRAPE UTILITY 15X26 W/TAPE STR (DRAPE) ×3 IMPLANT
DRAPE WARM FLUID 44X44 (DRAPE) ×2 IMPLANT
DRSG COVADERM 4X10 (GAUZE/BANDAGES/DRESSINGS) ×1 IMPLANT
DRSG OPSITE POSTOP 4X10 (GAUZE/BANDAGES/DRESSINGS) IMPLANT
DRSG OPSITE POSTOP 4X8 (GAUZE/BANDAGES/DRESSINGS) IMPLANT
ELECT BLADE 6.5 EXT (BLADE) IMPLANT
ELECT CAUTERY BLADE 6.4 (BLADE) ×4 IMPLANT
ELECT REM PT RETURN 9FT ADLT (ELECTROSURGICAL) ×2
ELECTRODE REM PT RTRN 9FT ADLT (ELECTROSURGICAL) ×1 IMPLANT
EVACUATOR SILICONE 100CC (DRAIN) ×1 IMPLANT
GAUZE SPONGE 4X4 12PLY STRL (GAUZE/BANDAGES/DRESSINGS) ×1 IMPLANT
GLOVE BIO SURGEON STRL SZ7 (GLOVE) ×3 IMPLANT
GLOVE BIO SURGEON STRL SZ7.5 (GLOVE) ×1 IMPLANT
GLOVE BIOGEL PI IND STRL 7.5 (GLOVE) ×1 IMPLANT
GLOVE BIOGEL PI INDICATOR 7.5 (GLOVE) ×3
GLOVE SURG SS PI 7.0 STRL IVOR (GLOVE) ×1 IMPLANT
GOWN STRL REUS W/ TWL LRG LVL3 (GOWN DISPOSABLE) ×3 IMPLANT
GOWN STRL REUS W/TWL LRG LVL3 (GOWN DISPOSABLE) ×4
KIT BASIN OR (CUSTOM PROCEDURE TRAY) ×2 IMPLANT
KIT ROOM TURNOVER OR (KITS) ×2 IMPLANT
LIGASURE IMPACT 36 18CM CVD LR (INSTRUMENTS) IMPLANT
MARKER SKIN DUAL TIP RULER LAB (MISCELLANEOUS) ×1 IMPLANT
MESH VENT ST 13.8X17.8CM L OVL (Mesh General) ×1 IMPLANT
NS IRRIG 1000ML POUR BTL (IV SOLUTION) ×4 IMPLANT
PACK GENERAL/GYN (CUSTOM PROCEDURE TRAY) ×2 IMPLANT
PAD ARMBOARD 7.5X6 YLW CONV (MISCELLANEOUS) ×2 IMPLANT
PENCIL BUTTON HOLSTER BLD 10FT (ELECTRODE) IMPLANT
SPECIMEN JAR LARGE (MISCELLANEOUS) IMPLANT
SPONGE LAP 18X18 X RAY DECT (DISPOSABLE) IMPLANT
STAPLER VISISTAT 35W (STAPLE) ×2 IMPLANT
SUCTION POOLE TIP (SUCTIONS) ×2 IMPLANT
SUT ETHILON 2 0 FS 18 (SUTURE) ×1 IMPLANT
SUT NOVA NAB DX-16 0-1 5-0 T12 (SUTURE) ×2 IMPLANT
SUT NOVA NAB GS-21 0 18 T12 DT (SUTURE) ×4 IMPLANT
SUT PDS AB 1 TP1 96 (SUTURE) ×4 IMPLANT
SUT SILK 2 0 SH CR/8 (SUTURE) ×2 IMPLANT
SUT SILK 2 0 TIES 10X30 (SUTURE) ×2 IMPLANT
SUT SILK 3 0 SH CR/8 (SUTURE) ×2 IMPLANT
SUT SILK 3 0 TIES 10X30 (SUTURE) ×2 IMPLANT
SUT VIC AB 3-0 SH 18 (SUTURE) IMPLANT
TAPE CLOTH SURG 4X10 WHT LF (GAUZE/BANDAGES/DRESSINGS) ×1 IMPLANT
TOWEL OR 17X26 10 PK STRL BLUE (TOWEL DISPOSABLE) ×2 IMPLANT
TRAY FOLEY CATH 16FRSI W/METER (SET/KITS/TRAYS/PACK) ×1 IMPLANT
TUBE CONNECTING 12X1/4 (SUCTIONS) IMPLANT
YANKAUER SUCT BULB TIP NO VENT (SUCTIONS) IMPLANT

## 2014-06-10 NOTE — ED Notes (Signed)
Dr. Georgette Dover (Surgery) at [facility]

## 2014-06-10 NOTE — ED Notes (Signed)
Pharm med rec tech speaking with pt at [facility].

## 2014-06-10 NOTE — Anesthesia Postprocedure Evaluation (Signed)
Anesthesia Post Note  Patient: Tanner Webb  Procedure(s) Performed: Procedure(s) (LRB): EXPLORATORY LAPAROTOMY, ventral hernia repair (N/A)  Anesthesia type: general  Patient location: PACU  Post pain: Pain level controlled  Post assessment: Patient's Cardiovascular Status Stable  Last Vitals:  Filed Vitals:   06/10/14 0745  BP: 130/85  Pulse:   Temp:   Resp:     Post vital signs: Reviewed and stable  Level of consciousness: sedated  Complications: No apparent anesthesia complications

## 2014-06-10 NOTE — Anesthesia Preprocedure Evaluation (Addendum)
Anesthesia Evaluation  Patient identified by MRN, date of birth, ID band Patient awake    Reviewed: Allergy & Precautions, H&P , NPO status , Patient's Chart, lab work & pertinent test results  Airway Mallampati: I  TM Distance: >3 FB Neck ROM: Full    Dental  (+) Teeth Intact, Dental Advisory Given   Pulmonary Current Smoker, former smoker,  breath sounds clear to auscultation        Cardiovascular hypertension, Rhythm:Regular Rate:Normal     Neuro/Psych    GI/Hepatic (+) Hepatitis -, C  Endo/Other    Renal/GU      Musculoskeletal   Abdominal   Peds  Hematology   Anesthesia Other Findings   Reproductive/Obstetrics                            Anesthesia Physical Anesthesia Plan  ASA: III and emergent  Anesthesia Plan: General   Post-op Pain Management:    Induction: Intravenous, Rapid sequence and Cricoid pressure planned  Airway Management Planned: Oral ETT  Additional Equipment:   Intra-op Plan:   Post-operative Plan: Extubation in OR  Informed Consent:   Dental advisory given  Plan Discussed with: CRNA and Surgeon  Anesthesia Plan Comments:        Anesthesia Quick Evaluation

## 2014-06-10 NOTE — ED Notes (Addendum)
Care handed over to OR/CRNA, no changes, VSS, consent signed at [facility]. Vanc infusing.

## 2014-06-10 NOTE — Op Note (Signed)
Ventral Hernia Repair Procedure Note  Indications: Symptomatic incarcerated ventral hernia with small bowel obstruction and multiple additional midline fascial defects  Pre-operative Diagnosis: Incarcerated ventral hernia with multiple adjacent swiss-cheese defects  Post-operative Diagnosis: same  Surgeon: Maia Petties.   Assistants: none  Anesthesia: General endotracheal anesthesia  ASA Class: 2E  Procedure Details  The patient was seen in the Holding Room. The risks, benefits, complications, treatment options, and expected outcomes were discussed with the patient. The possibilities of reaction to medication, pulmonary aspiration, perforation of viscus, bleeding, recurrent infection, the need for additional procedures, failure to diagnose a condition, and creating a complication requiring transfusion or operation were discussed with the patient. The patient concurred with the proposed plan, giving informed consent.  The site of surgery properly noted/marked. The patient was taken to the operating room, identified as Tanner Webb and the procedure verified as ventral hernia repair. A Time Out was held and the above information confirmed.  The patient was placed supine.  After establishing general anesthesia, a Foley catheter was placed under sterile technique.  The abdomen was prepped with Chloraprep and draped in sterile fashion.  We made a vertical incision over the palpable hernia and the small upper midline hernias Dissection was carried down to one of the smaller hernia sacs located above the fascia and mobilized from surrounding structures.   The hernia sac was opened a moderate amount of omentum was reduced.  This fascial defect was less than 1 cm, but was enlarged to allow entry into the peritoneal cavity.  Multiple (at least 7) swiss-cheese defects were encountered as we opened the midline fascia.  Omentum and preperitoneal fat were reduced from these hernias.  A loop of small  bowel had become incarcerated in a small defect just to the left of the umbilicus.  We enlarged the defect and reduced the small bowel, which appeared viable with no sign of ischemia or perforation.  Once we had reduced all of the hernias and connected all of the defects into one large defect, we inspected the fascia around the defect.  This measured about 13 cm x 5 cm.  We used a 17.8 x 13 cm Ventrio mesh and secured this to the fascia with interrupted 0 Novofil sutures.  The fascial defect was reapproximated with interrupted figure-of-8 1 Novofil sutures.  The subcutaneous tissues were irrigated.  A drain was placed into the subcutaneous space through a stab incision and secured with 2-0 Ethilon.  The skin incision was closed with staples and a dry dressing was applied.  The drain was placed to bulb suction. Instrument, sponge, and needle counts were correct prior to closure and at the conclusion of the case.   Findings: Multiple small fascial defects with one defect containing a loop of viable small bowel  Estimated Blood Loss:  less than 100 mL         Drains: JP drain in subcutaneous space; Foley catheter                      Complications:  None; patient tolerated the procedure well.         Disposition: PACU - hemodynamically stable.         Condition: stable  Tanner Webb. Tanner Dover, MD, [facility] Surgery  General/ Trauma Surgery  06/10/2014 6:15 AM

## 2014-06-10 NOTE — ED Notes (Signed)
Back from CRT, pending results, no changes.

## 2014-06-10 NOTE — ED Notes (Signed)
No changes, alert, ND, calm, c/o pain, speaking on phone with wife. PO contrast finished. Pending CT.
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
## 2014-06-10 NOTE — Transfer of Care (Signed)
Immediate Anesthesia Transfer of Care Note  Patient: Tanner Webb  Procedure(s) Performed: Procedure(s): EXPLORATORY LAPAROTOMY, ventral hernia repair (N/A)  Patient Location: PACU  Anesthesia Type:General  Level of Consciousness: awake, alert  and oriented  Airway & Oxygen Therapy: Patient connected to nasal cannula oxygen  Post-op Assessment: Report given to PACU RN, Post -op Vital signs reviewed and stable and Patient moving all extremities X 4  Post vital signs: Reviewed and stable  Complications: No apparent anesthesia complications

## 2014-06-10 NOTE — ED Notes (Signed)
Pt alert, NAD, calm, interactive, resps e/u, speaking in clear complete sentences, VSS, returned to baseline.  Dr. Sharol Given attempted to reduce hernia, unsuccessful. Pt tolerated well.

## 2014-06-10 NOTE — ED Notes (Addendum)
Here by [facility], here for abd pain and swelling. First noticed at home around 1800.  Pain onset prior to eating. Last ate at 1815. Last BM 1830 (small). Dr. Sharol Given present on arrival. Pt alert, moaning, NAD, calm, interactive, resps e/u, speaking in clear complete sentences. No dyspnea noted. abd hernia noted L of umbilicus. No bruising noted.

## 2014-06-10 NOTE — Progress Notes (Signed)
Report given to diane walker rn as cargiver 

## 2014-06-10 NOTE — ED Notes (Signed)
To CT
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
## 2014-06-10 NOTE — ED Notes (Signed)
Hernia reduction under conscious sedation discussed with pt. Pt agreeable. Pt on monitor, pulse ox, O2 [place] 4L in trauma room B. VSS.

## 2014-06-10 NOTE — Progress Notes (Signed)
Personal belongings returned to PT, phone, wallet and keys. Pt called daughter

## 2014-06-10 NOTE — Anesthesia Procedure Notes (Signed)
Procedure Name: Intubation Date/Time: 06/10/2014 4:01 AM Performed by: Valetta Fuller Pre-anesthesia Checklist: Patient identified, Emergency Drugs available, Suction available and Patient being monitored Patient Re-evaluated:Patient Re-evaluated prior to inductionOxygen Delivery Method: Circle system utilized Preoxygenation: Pre-oxygenation with 100% oxygen Intubation Type: IV induction, Rapid sequence and Cricoid Pressure applied Laryngoscope Size: Miller and 2 Grade View: Grade I Tube size: 7.5 mm Number of attempts: 1 Airway Equipment and Method: Stylet Placement Confirmation: ETT inserted through vocal cords under direct vision,  positive ETCO2 and breath sounds checked- equal and bilateral Secured at: 23 cm Tube secured with: Tape Dental Injury: Teeth and Oropharynx as per pre-operative assessment

## 2014-06-11 LAB — BASIC METABOLIC PANEL
ANION GAP: 10 (ref 5–15)
BUN: 4 mg/dL — ABNORMAL LOW (ref 6–23)
CHLORIDE: 98 meq/L (ref 96–112)
CO2: 28 meq/L (ref 19–32)
CREATININE: 0.54 mg/dL (ref 0.50–1.35)
Calcium: 8.9 mg/dL (ref 8.4–10.5)
GFR calc Af Amer: >90 mL/min (ref >90)
GFR calc non Af Amer: >90 mL/min (ref >90)
Glucose, Bld: 108 mg/dL — ABNORMAL HIGH (ref 70–99)
Potassium: 5 mEq/L (ref 3.7–5.3)
SODIUM: 136 meq/L — AB (ref 137–147)

## 2014-06-11 LAB — CBC
HCT: 47.7 % (ref 39.0–52.0)
HEMOGLOBIN: 16.4 g/dL (ref 13.0–17.0)
MCH: 33 pg (ref 26.0–34.0)
MCHC: 34.4 g/dL (ref 30.0–36.0)
MCV: 96 fL (ref 78.0–100.0)
Platelets: 253 10*3/uL (ref 150–400)
RBC: 4.97 MIL/uL (ref 4.22–5.81)
RDW: 13.6 % (ref 11.5–15.5)
WBC: 14.9 10*3/uL — ABNORMAL HIGH (ref 4.0–10.5)

## 2014-06-11 MED ORDER — DOCUSATE SODIUM 100 MG PO CAPS
100.0000 mg | ORAL_CAPSULE | Freq: Two times a day (BID) | ORAL | Status: DC
  Administered 2014-06-11 – 2014-06-13 (×5): 100 mg via ORAL
  Filled 2014-06-11 (×6): qty 1

## 2014-06-11 MED ORDER — HYDROCODONE-ACETAMINOPHEN 5-325 MG PO TABS
1.0000 | ORAL_TABLET | ORAL | Status: DC | PRN
  Administered 2014-06-11 – 2014-06-14 (×11): 2 via ORAL
  Filled 2014-06-11 (×11): qty 2

## 2014-06-11 MED ORDER — HYDROMORPHONE HCL 1 MG/ML IJ SOLN
0.5000 mg | INTRAMUSCULAR | Status: DC | PRN
  Administered 2014-06-11 – 2014-06-12 (×7): 1 mg via INTRAVENOUS
  Filled 2014-06-11 (×7): qty 1

## 2014-06-11 MED ORDER — METHOCARBAMOL 500 MG PO TABS
1000.0000 mg | ORAL_TABLET | Freq: Three times a day (TID) | ORAL | Status: DC | PRN
  Administered 2014-06-11 – 2014-06-14 (×7): 1000 mg via ORAL
  Filled 2014-06-11 (×7): qty 2

## 2014-06-11 NOTE — Progress Notes (Signed)
Patient doing well but needs an abdominal binder.  Ambulate.  Ileus.  Told him he would probably be out o work for 6 weeks.  Kathryne Eriksson. Dahlia Bailiff, MD, [place] [phone] [facility]

## 2014-06-11 NOTE — Progress Notes (Signed)
[facility] Progress Note  1 Day Post-Op  Subjective: Pt doing well, fighting with PCA, doesn't really want to continue it.  No N/V, tolerating clears.  Foley just removed.  No flatus or BM yet.  Not really mobilized yet.    Objective: Vital signs in last 24 hours: Temp:  [97.5 F (36.4 C)-99 F (37.2 C)] 99 F (37.2 C) (10/29 0546) Pulse Rate:  [69-105] 84 (10/29 0546) Resp:  [7-23] 20 (10/29 0546) BP: (117-154)/(66-89) 154/85 mmHg (10/29 0546) SpO2:  [92 %-97 %] 97 % (10/29 0546) Weight:  [215 lb (97.523 kg)] 215 lb (97.523 kg) (10/28 1207) Last BM Date: 06/08/14  Intake/Output from previous day: 10/28 0701 - 10/29 0700 In: 1587 [I.V.:1577] Out: 2570 [Urine:2450; Drains:120] Intake/Output this shift:    PE: Gen:  Alert, NAD, pleasant Abd: Soft, mild distension, tenderness in RLQ, +BS, no HSM, incision dressings in place, drain with sanguinous drainage (165mL/24hr)   Lab Results:   Recent Labs  06/10/14 0012 06/11/14 0409  WBC 11.9* 14.9*  HGB 15.6 16.4  HCT 44.0 47.7  PLT 360 253   BMET  Recent Labs  06/10/14 0012 06/10/14 1508 06/11/14 0409  NA 139  --  136*  K 3.5*  --  5.0  CL 97  --  98  CO2 26  --  28  GLUCOSE 133*  --  108*  BUN 7  --  4*  CREATININE 0.69 0.58 0.54  CALCIUM 9.2  --  8.9   PT/INR No results found for this basename: LABPROT, INR,  in the last 72 hours CMP     Component Value Date/Time   NA 136* 06/11/2014 0409   K 5.0 06/11/2014 0409   CL 98 06/11/2014 0409   CO2 28 06/11/2014 0409   GLUCOSE 108* 06/11/2014 0409   BUN 4* 06/11/2014 0409   CREATININE 0.54 06/11/2014 0409   CREATININE 0.74 10/24/2013 1711   CALCIUM 8.9 06/11/2014 0409   PROT 6.5 06/10/2014 0012   ALBUMIN 3.7 06/10/2014 0012   AST 47* 06/10/2014 0012   ALT 68* 06/10/2014 0012   ALKPHOS 82 06/10/2014 0012   BILITOT 0.4 06/10/2014 0012   GFRNONAA >90 06/11/2014 0409   GFRAA >90 06/11/2014 0409   Lipase     Component Value Date/Time    LIPASE 21 09/25/2009 0222       Studies/Results: Ct Abdomen Pelvis Wo Contrast  06/10/2014   CLINICAL DATA:  Severe mid abdominal pain beginning at 1800 hr yesterday, history of anterior abdominal wall hernia.  EXAM: CT ABDOMEN AND PELVIS WITHOUT CONTRAST  TECHNIQUE: Multidetector CT imaging of the abdomen and pelvis was performed following the standard protocol without IV contrast. Oral contrast administered.  COMPARISON:  None.  FINDINGS: LUNG BASES: Included view of the lung bases demonstrate dependent atelectasis. The visualized heart and pericardium are unremarkable.  KIDNEYS/BLADDER: Kidneys are orthotopic, demonstrating normal size and morphology. No nephrolithiasis, hydronephrosis; limited assessment for renal masses on this nonenhanced examination. The unopacified ureters are normal in course and caliber. Urinary bladder is partially distended and unremarkable.  SOLID ORGANS: The pancreas and adrenal glands are unremarkable for this non-contrast examination. Status post splenectomy and cholecystectomy. 19 mm low-density focus adjacent to the falciform ligament could reflect focal fatty infiltration or cyst.  GASTROINTESTINAL TRACT: The stomach is distended with contrast. Multiple ventral hernias. Small to moderate LEFT ventral hernia sac contains small bowel, approximately 17 mm neck, the small bowel proximal to the hernia is dilated at 4.1 cm. Fluid  filled bowel within the hernia sac without inflammatory changes. No pneumatosis. The large bowel is nonacute. Mild diverticulosis. Normal appendix.  PERITONEUM/RETROPERITONEUM: No intraperitoneal free fluid nor free air. Aortoiliac vessels are normal in course and caliber, mild calcific atherosclerosis. No lymphadenopathy by CT size criteria. Internal reproductive organs are unremarkable.  SOFT TISSUES/ OSSEOUS STRUCTURES: Multiple fat containing ventral hernias, in addition to a hernia sac containing small bowel described above. Anterior abdominal wall  scarring. Small bilateral fat containing inguinal hernias. Subcentimeter presumed bone island RIGHT femoral neck. Cystic degenerative change of the RIGHT hip. Mild levoscoliosis.  IMPRESSION: Small bowel obstruction, with transition point in LEFT small to moderate narrow necked ventral hernia which contains a loop of small bowel without inflammation. Multiple additional fat containing ventral hernias.  Status post cholecystectomy and splenectomy.   Electronically Signed   By: Elon Alas   On: 06/10/2014 03:08   Dg Chest Port 1 View  06/10/2014   CLINICAL DATA:  Preop for hernia rupture.  EXAM: PORTABLE CHEST - 1 VIEW  COMPARISON:  05/09/2012  FINDINGS: Upper normal heart size. Mediastinal contours otherwise within normal range. Linear opacities within the lung bases, favor atelectasis. No pleural effusion or pneumothorax. Sequelae of prior posterior right second and third rib fractures.  IMPRESSION: Mild lung base opacities, favor atelectasis. Heart size upper normal. Otherwise, no acute process identified.   Electronically Signed   By: Carlos Levering M.D.   On: 06/10/2014 03:13    Anti-infectives: Anti-infectives   Start     Dose/Rate Route Frequency Ordered Stop   06/10/14 0300  [MAR Hold]  vancomycin (VANCOCIN) IVPB 1000 mg/200 mL premix     (On MAR Hold since 06/10/14 0351)   1,000 mg 200 mL/hr over 60 Minutes Intravenous  Once 06/10/14 0255 06/10/14 0400       Assessment/Plan Incarcerated ventral hernia with multiple adjacent swiss-cheese defects POD #1 s/p ventral hernia repair Leukocytosis - 14.9 - likely reactive from surgery  Plan: 1.  On clears, no flatus, await bowel function prior to advancing 2.  Pain control - d/c PCA and start orals and IVP, antiemetics prn 3.  Ambulate and IS 4.  SCD's and lovenox 5.  Red IVF 6.  Resumed home meds 7.  Add bowel regimen 8.  Change dressing tomorrow    LOS: 1 day    Coralie Keens 06/11/2014, 8:02 AM Pager: [phone]

## 2014-06-12 ENCOUNTER — Encounter (HOSPITAL_COMMUNITY): Payer: Self-pay | Admitting: Surgery

## 2014-06-12 ENCOUNTER — Inpatient Hospital Stay (HOSPITAL_COMMUNITY): Payer: 59

## 2014-06-12 LAB — CBC
HCT: 45.2 % (ref 39.0–52.0)
Hemoglobin: 15.4 g/dL (ref 13.0–17.0)
MCH: 32.2 pg (ref 26.0–34.0)
MCHC: 34.1 g/dL (ref 30.0–36.0)
MCV: 94.6 fL (ref 78.0–100.0)
Platelets: 316 10*3/uL (ref 150–400)
RBC: 4.78 MIL/uL (ref 4.22–5.81)
RDW: 13.4 % (ref 11.5–15.5)
WBC: 18.9 10*3/uL — ABNORMAL HIGH (ref 4.0–10.5)

## 2014-06-12 LAB — BASIC METABOLIC PANEL
Anion gap: 8 (ref 5–15)
BUN: 4 mg/dL — ABNORMAL LOW (ref 6–23)
CALCIUM: 9 mg/dL (ref 8.4–10.5)
CO2: 30 mEq/L (ref 19–32)
Chloride: 102 mEq/L (ref 96–112)
Creatinine, Ser: 0.68 mg/dL (ref 0.50–1.35)
GFR calc Af Amer: >90 mL/min (ref >90)
Glucose, Bld: 102 mg/dL — ABNORMAL HIGH (ref 70–99)
POTASSIUM: 4.3 meq/L (ref 3.7–5.3)
SODIUM: 140 meq/L (ref 137–147)

## 2014-06-12 MED ORDER — IPRATROPIUM-ALBUTEROL 0.5-2.5 (3) MG/3ML IN SOLN: 3.0000 mL | RESPIRATORY_TRACT | Status: DC | PRN

## 2014-06-12 MED ORDER — IPRATROPIUM-ALBUTEROL 0.5-2.5 (3) MG/3ML IN SOLN
3.0000 mL | RESPIRATORY_TRACT | Status: DC
  Administered 2014-06-12 (×4): 3 mL via RESPIRATORY_TRACT
  Filled 2014-06-12 (×3): qty 3

## 2014-06-12 MED ORDER — POLYETHYLENE GLYCOL 3350 17 G PO PACK
17.0000 g | PACK | Freq: Every day | ORAL | Status: DC
  Administered 2014-06-12 – 2014-06-13 (×2): 17 g via ORAL
  Filled 2014-06-12 (×3): qty 1

## 2014-06-12 MED ORDER — GUAIFENESIN ER 600 MG PO TB12
600.0000 mg | ORAL_TABLET | Freq: Two times a day (BID) | ORAL | Status: DC
  Administered 2014-06-12 – 2014-06-13 (×4): 600 mg via ORAL
  Filled 2014-06-12 (×6): qty 1

## 2014-06-12 NOTE — Progress Notes (Signed)
Increased WBC count is of unknown etiology.  He seems better.  X-rays do show free air, but this is likely residual from laparotomy.  Clinically he is improved.  Will repeat CBC tomorrow.  Kathryne Eriksson. Dahlia Bailiff, MD, [place] [phone] [facility]

## 2014-06-12 NOTE — Progress Notes (Signed)
[facility] Progress Note  2 Days Post-Op  Subjective: Feeling better, but c/o coughing and phlegm production.  Abdominal pain improving.  No N/V, tolerating clears.  Ambulating in the room.  Has abdominal binder.  Significant other at bedside.  IS to 1250.  Objective: Vital signs in last 24 hours: Temp:  [97.7 F (36.5 C)-98.4 F (36.9 C)] 97.7 F (36.5 C) (10/30 0626) Pulse Rate:  [82-91] 91 (10/30 0626) Resp:  [16-26] 18 (10/30 0626) BP: (131-151)/(70-81) 150/81 mmHg (10/30 0626) SpO2:  [93 %-96 %] 93 % (10/30 0626) Last BM Date: 06/08/14  Intake/Output from previous day: 10/29 0701 - 10/30 0700 In: 1030 [P.O.:480; I.V.:550] Out: 550 [Urine:500; Drains:50] Intake/Output this shift:    PE: Gen:  Alert, NAD, pleasant Card:  RRR, no M/G/R heard Pulm:  Low effort, no W/R/R, diminished breath sounds throughout - IS to 1250 Abd: Soft, tender over midline wound, abdominal binder in place, +BS, no HSM, incisions with clean dressings in place, drain with sanguinous drainage 43mL/24hr  Ext:  No erythema, edema, or tenderness  Lab Results:   Recent Labs  06/11/14 0409 06/12/14 0408  WBC 14.9* 18.9*  HGB 16.4 15.4  HCT 47.7 45.2  PLT 253 316   BMET  Recent Labs  06/11/14 0409 06/12/14 0408  NA 136* 140  K 5.0 4.3  CL 98 102  CO2 28 30  GLUCOSE 108* 102*  BUN 4* 4*  CREATININE 0.54 0.68  CALCIUM 8.9 9.0   PT/INR No results found for this basename: LABPROT, INR,  in the last 72 hours CMP     Component Value Date/Time   NA 140 06/12/2014 0408   K 4.3 06/12/2014 0408   CL 102 06/12/2014 0408   CO2 30 06/12/2014 0408   GLUCOSE 102* 06/12/2014 0408   BUN 4* 06/12/2014 0408   CREATININE 0.68 06/12/2014 0408   CREATININE 0.74 10/24/2013 1711   CALCIUM 9.0 06/12/2014 0408   PROT 6.5 06/10/2014 0012   ALBUMIN 3.7 06/10/2014 0012   AST 47* 06/10/2014 0012   ALT 68* 06/10/2014 0012   ALKPHOS 82 06/10/2014 0012   BILITOT 0.4 06/10/2014 0012   GFRNONAA >90 06/12/2014 0408   GFRAA >90 06/12/2014 0408   Lipase     Component Value Date/Time   LIPASE 21 09/25/2009 0222       Studies/Results: No results found.  Anti-infectives: Anti-infectives   Start     Dose/Rate Route Frequency Ordered Stop   06/10/14 0300  [MAR Hold]  vancomycin (VANCOCIN) IVPB 1000 mg/200 mL premix     (On MAR Hold since 06/10/14 0351)   1,000 mg 200 mL/hr over 60 Minutes Intravenous  Once 06/10/14 0255 06/10/14 0400       Assessment/Plan Incarcerated ventral hernia with multiple adjacent swiss-cheese defects POD #1 s/p ventral hernia repair  Leukocytosis - 18.9 - afebrile, will check infection panel SOB/coughing Tobacco use  Plan:  1. Tolerating clears, advance to fulls, soft after he has a BM 2. Pain control - Encourage orals 3. Ambulate and IS  4. SCD's and lovenox  5. Red IVF  6. Home meds  7. bowel regimen  8. Change dressing today 9. CXR, UA, KUB given leukocytosis - seems to have a lot of coughing/phelgm coming up  10. Start duonebs 11. D/c tomorrow if WBC improved and tolerating solid diet  Discussed care with his significant other at bedside   LOS: 2 days    DORT, [facility] 06/12/2014, 8:17 AM Pager: [phone]

## 2014-06-13 LAB — URINALYSIS, ROUTINE W REFLEX MICROSCOPIC
Bilirubin Urine: NEGATIVE
Glucose, UA: NEGATIVE mg/dL
Hgb urine dipstick: NEGATIVE
KETONES UR: NEGATIVE mg/dL
Leukocytes, UA: NEGATIVE
Nitrite: NEGATIVE
Protein, ur: NEGATIVE mg/dL
Specific Gravity, Urine: 1.01 (ref 1.005–1.030)
UROBILINOGEN UA: 2 mg/dL — AB (ref 0.0–1.0)
pH: 8.5 — ABNORMAL HIGH (ref 5.0–8.0)

## 2014-06-13 MED ORDER — BISACODYL 10 MG RE SUPP
10.0000 mg | Freq: Once | RECTAL | Status: AC
  Administered 2014-06-13: 10 mg via RECTAL
  Filled 2014-06-13: qty 1

## 2014-06-13 NOTE — Progress Notes (Signed)
3 Days Post-Op  Subjective: He is passing gas, no BM, sounds like he has some constipation issues at home anyway.  His wife says his stomach is much smaller this AM.  He is moving but slowly.  Objective: Vital signs in last 24 hours: Temp:  [97.6 F (36.4 C)-98.4 F (36.9 C)] 98.4 F (36.9 C) (10/31 0520) Pulse Rate:  [69-88] 69 (10/31 0520) Resp:  [16-20] 20 (10/31 0520) BP: (132-151)/(70-86) 142/76 mmHg (10/31 0520) SpO2:  [93 %-98 %] 98 % (10/31 0520) Last BM Date: 06/08/14 1080 PO Diet:  Full liquids 40 ML from drain Afebrile, VSS No labs last WBC 18.9 Film yesterday ? ileus Intake/Output from previous day: 10/30 0701 - 10/31 0700 In: 1465.8 [P.O.:1080; I.V.:385.8] Out: 41 [Urine:1; Drains:40] Intake/Output this shift:    General appearance: alert, cooperative and no distress Resp: clear to auscultation bilaterally GI: soft sore, few BS, not distended.  Wound OK, Drain is serous fluid.  Lab Results:   Recent Labs  06/11/14 0409 06/12/14 0408  WBC 14.9* 18.9*  HGB 16.4 15.4  HCT 47.7 45.2  PLT 253 316    BMET  Recent Labs  06/11/14 0409 06/12/14 0408  NA 136* 140  K 5.0 4.3  CL 98 102  CO2 28 30  GLUCOSE 108* 102*  BUN 4* 4*  CREATININE 0.54 0.68  CALCIUM 8.9 9.0   PT/INR No results found for this basename: LABPROT, INR,  in the last 72 hours   Recent Labs Lab 06/10/14 0012  AST 47*  ALT 68*  ALKPHOS 82  BILITOT 0.4  PROT 6.5  ALBUMIN 3.7     Lipase     Component Value Date/Time   LIPASE 21 09/25/2009 0222     Studies/Results: Dg Chest 2 View  06/12/2014   CLINICAL DATA:  Shortness of breath, abdominal pain.  EXAM: CHEST  2 VIEW  COMPARISON:  June 10, 2014.  FINDINGS: The heart size and mediastinal contours are within normal limits. No pneumothorax is noted. Minimal bilateral pleural effusions are noted. Old right rib fracture is noted. Pneumoperitoneum is noted under the right hemidiaphragm consistent with recent abdominal  surgery. Stable mild central pulmonary vascular congestion is noted. Minimal bibasilar subsegmental atelectasis is noted.  IMPRESSION: Minimal bilateral pleural effusions. Stable mild central pulmonary vascular congestion. Minimal bibasilar subsegmental atelectasis. Pneumoperitoneum is noted consistent with recent abdominal surgery.   Electronically Signed   By: Sabino Dick M.D.   On: 06/12/2014 09:33   Dg Abd 2 Views  06/12/2014   CLINICAL DATA:  Postoperative abdominal pain.  EXAM: ABDOMEN - 2 VIEW  COMPARISON:  CT scan of June 10, 2014  FINDINGS: Midline surgical staples are noted. Large surgical drain is noted in the central portion of the abdomen. Residual contrast is noted in the right colon. Pneumoperitoneum is noted under right hemidiaphragm consistent with recent abdominal surgery. Status post cholecystectomy. No significant small bowel dilatation is noted. Air-filled colon is noted in the right upper quadrant which may represent focal ileus.  IMPRESSION: Possible focal colonic ileus seen in right upper quadrant. No small bowel dilatation is seen. Pneumoperitoneum is noted consistent with recent abdominal surgery.   Electronically Signed   By: Sabino Dick M.D.   On: 06/12/2014 09:36    Medications: . docusate sodium  100 mg Oral BID  . enoxaparin (LOVENOX) injection  40 mg Subcutaneous Q24H  . guaiFENesin  600 mg Oral BID  . polyethylene glycol  17 g Oral Daily   . dextrose  5 % and 0.45 % NaCl with KCl 20 mEq/L 10 mL/hr at 06/13/14 0515   Prior to Admission medications   Medication Sig Start Date End Date Taking? Authorizing Provider  ALPRAZolam Duanne Moron) 0.5 MG tablet Take 1 tablet (0.5 mg total) by mouth at bedtime as needed. 12/29/13  Yes Jessica C Copland, MD  HYDROcodone-ibuprofen (VICOPROFEN) 7.5-200 MG per tablet Take 1 tablet by mouth every 8 (eight) hours as needed. 06/08/14  Yes Orma Flaming, MD  methocarbamol (ROBAXIN-750) 750 MG tablet Take 1 tablet (750 mg total) by mouth  4 (four) times daily. 06/08/14  Yes Orma Flaming, MD     Assessment/Plan Incarcerated ventral hernia with multiple adjacent swiss-cheese defects Ventral Hernia Repair , Donnie Mesa, MD,06/10/2014.  Hepatitis C Chronic left shoulder pain Dyslipidemia    Plan:  Dulcolax supp, soft diet ambulate in halls and home next 24-48 hours. Recheck labs in AM. He is already on Miralax and colace.  LOS: 3 days    Tanner Webb 06/13/2014

## 2014-06-13 NOTE — Progress Notes (Signed)
General Surgery [facility]  Patient seen and examined.  Ambulating.  Awaiting return of bowel function.  WBC elevated - ? Source.  Repeat CBC in AM 11/1.  Home soon.  Earnstine Regal, MD, [facility] Office: [phone]

## 2014-06-14 LAB — BASIC METABOLIC PANEL
Anion gap: 10 (ref 5–15)
BUN: 8 mg/dL (ref 6–23)
CALCIUM: 9 mg/dL (ref 8.4–10.5)
CHLORIDE: 104 meq/L (ref 96–112)
CO2: 27 mEq/L (ref 19–32)
CREATININE: 0.72 mg/dL (ref 0.50–1.35)
Glucose, Bld: 90 mg/dL (ref 70–99)
Potassium: 4.2 mEq/L (ref 3.7–5.3)
Sodium: 141 mEq/L (ref 137–147)

## 2014-06-14 LAB — CBC
HCT: 42.4 % (ref 39.0–52.0)
Hemoglobin: 14.5 g/dL (ref 13.0–17.0)
MCH: 32 pg (ref 26.0–34.0)
MCHC: 34.2 g/dL (ref 30.0–36.0)
MCV: 93.6 fL (ref 78.0–100.0)
PLATELETS: 429 10*3/uL — AB (ref 150–400)
RBC: 4.53 MIL/uL (ref 4.22–5.81)
RDW: 13.4 % (ref 11.5–15.5)
WBC: 10.7 10*3/uL — AB (ref 4.0–10.5)

## 2014-06-14 MED ORDER — OXYCODONE HCL 5 MG PO TABS: 5.0000 mg | ORAL_TABLET | Freq: Four times a day (QID) | ORAL | Status: DC | PRN
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
## 2014-06-14 NOTE — Progress Notes (Signed)
Pt discharged to home accomp by girlfriend.  Rx for Oxycodone given and explained.  All discharge instructions given and explained.  Pt to follow up with [facility] in 3 days.  # given.  Pt and friend shown how to empty JP drain and keep record of amount to take into the Dr.  Hyman Bible given.  Supplies given and explained how to change dressing mid abd.

## 2014-06-14 NOTE — Progress Notes (Signed)
Patient ID: Tanner Webb, male   DOB: [birth year], 63 y.o.   MRN: [id number]  [facility] - Progress Note  POD# 4  Subjective: Patient anxious to go home.  Had 2 BM's overnight.  Ambulatory.  Pain well controlled.  Objective: Vital signs in last 24 hours: Temp:  [97.3 F (36.3 C)-97.8 F (36.6 C)] 97.3 F (36.3 C) (11/01 0521) Pulse Rate:  [63-82] 63 (11/01 0521) Resp:  [18-20] 18 (11/01 0521) BP: (129-139)/(68-85) 139/85 mmHg (11/01 0521) SpO2:  [96 %-97 %] 97 % (11/01 0521) Last BM Date: 06/13/14  Intake/Output from previous day: 10/31 0701 - 11/01 0700 In: 440 [P.O.:440] Out: 446 [Urine:400; Drains:45; Stool:1]  Exam: HEENT - clear, not icteric Neck - soft Chest - clear bilaterally Cor - RRR, no murmur Abd - soft without distension; wound dry and intact; drain with 45cc serosanguinous per shift Ext - no significant edema Neuro - grossly intact, no focal deficits  Lab Results:   Recent Labs  06/12/14 0408 06/14/14 0535  WBC 18.9* 10.7*  HGB 15.4 14.5  HCT 45.2 42.4  PLT 316 429*     Recent Labs  06/12/14 0408 06/14/14 0535  NA 140 141  K 4.3 4.2  CL 102 104  CO2 30 27  GLUCOSE 102* 90  BUN 4* 8  CREATININE 0.68 0.72  CALCIUM 9.0 9.0    Studies/Results: No results found.  Assessment / Plan: 1.  Status post [facility] with mesh  Instruct patient and GF in drain care  Home today  Follow up at [place] office later this week for drain removal  Earnstine Regal, MD, [facility] Office: [phone]  06/14/2014

## 2014-08-06 ENCOUNTER — Other Ambulatory Visit: Payer: Self-pay | Admitting: Family Medicine

## 2014-08-06 DIAGNOSIS — G4700 Insomnia, unspecified: Secondary | ICD-10-CM

## 2014-08-10 NOTE — Telephone Encounter (Signed)
Called in.

## 2014-08-18 ENCOUNTER — Encounter: Payer: Self-pay | Admitting: Internal Medicine

## 2014-10-29 ENCOUNTER — Other Ambulatory Visit: Payer: Self-pay | Admitting: Family Medicine

## 2014-10-30 NOTE — Telephone Encounter (Signed)
Dr. Lorelei Pont is his primary care now and he is to see her for meds.

## 2014-11-02 NOTE — Telephone Encounter (Signed)
Follow dr. Lorelei Pont plan

## 2014-11-15 ENCOUNTER — Ambulatory Visit (INDEPENDENT_AMBULATORY_CARE_PROVIDER_SITE_OTHER): Payer: 59 | Admitting: Internal Medicine

## 2014-11-15 VITALS — BP 118/80 | HR 81 | Temp 97.5°F | Resp 18 | Ht 75.0 in | Wt 203.4 lb

## 2014-11-15 DIAGNOSIS — B182 Chronic viral hepatitis C: Secondary | ICD-10-CM

## 2014-11-15 DIAGNOSIS — G4700 Insomnia, unspecified: Secondary | ICD-10-CM | POA: Diagnosis not present

## 2014-11-15 DIAGNOSIS — G8929 Other chronic pain: Secondary | ICD-10-CM | POA: Diagnosis not present

## 2014-11-15 DIAGNOSIS — I10 Essential (primary) hypertension: Secondary | ICD-10-CM | POA: Diagnosis not present

## 2014-11-15 DIAGNOSIS — M25512 Pain in left shoulder: Secondary | ICD-10-CM | POA: Diagnosis not present

## 2014-11-15 MED ORDER — HYDROCODONE-IBUPROFEN 7.5-200 MG PO TABS: 1.0000 | ORAL_TABLET | Freq: Three times a day (TID) | ORAL | Status: DC | PRN

## 2014-11-15 MED ORDER — ALPRAZOLAM 1 MG PO TABS: 0.5000 mg | ORAL_TABLET | Freq: Every evening | ORAL | Status: DC | PRN

## 2014-11-15 NOTE — Progress Notes (Signed)
Subjective:    Patient ID: Tanner Webb, male    DOB: 06-29-1951, 64 y.o.   MRN: [id number] This chart was scribed for Tanner Lin, MD by Marti Sleigh, Medical Scribe. This patient was seen in Room 5 and the patient's care was started a 9:09 AM.  Chief Complaint  Patient presents with  . Hypertension  . Medication Refill    Xanax 0.5mg , Vicoprofen 7.5-200mg     HPI HPI Comments: Tanner Webb is a 64 y.o. male with a hx of hepatitis C and HTN who presents to [facility] reporting for a medication refill. Pt states he needs a refill on his Xanax medication and hydrocodone. Pt states he takes xanax every night. Pt states he takes hydrocodone when he is in pain(chronic shoulder). Pt states the swelling in his legs at his last visit has resolved. Pt states he has lost weight on purpose and is feeling much better.  Patient Active Problem List   Diagnosis Date Noted  . Incarcerated ventral hernia----note recent surgery  06/10/2014  . Smoker----not ready to quit  06/08/2014  . Unspecified essential hypertension--- home blood pressures okay since he lost weight and he has not been on medication for good while  10/24/2013  . Hepatitis C-----recently started harvoni. Pt states he will be on it for six more months. 10/06/2012  . 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 Helpful  03/30/2012    -  Insomnia--response to alprazolam  He still has a number of stressors: Pt states his mother has alzheimer's disease and he has recently put her in a home. Pt state he has been cleaning out her house and has as much on his plate as he can handle. Pt states his daughter recently moved to town with her three grandchildren that has been good, but has been an additional stressor.  Did not complete colonoscopy as referred last year but he will call them for an appointment Thinks his immunizations are up-to-date  Review of Systems  Constitutional:  Negative for fever, chills, activity change, appetite change and unexpected weight change.  HENT: Negative for trouble swallowing.   Eyes: Negative for visual disturbance.  Respiratory: Negative for chest tightness and shortness of breath.   Cardiovascular: Negative for chest pain, palpitations and leg swelling.  Gastrointestinal: Negative for abdominal pain.  Genitourinary: Negative for difficulty urinating.  Skin: Negative for rash.  Neurological: Negative for headaches.  Psychiatric/Behavioral: Positive for sleep disturbance. Negative for decreased concentration. The patient is nervous/anxious.        Objective:   Physical Exam  Constitutional: He is oriented to person, place, and time. He appears well-developed and well-nourished. No distress.  HENT:  Head: Normocephalic and atraumatic.  Eyes: Pupils are equal, round, and reactive to light.  Neck: Neck supple. No thyromegaly present.  Cardiovascular: Normal rate, regular rhythm and normal heart sounds.   No murmur heard. Pulmonary/Chest: Effort normal. No respiratory distress.  Musculoskeletal: Normal range of motion. He exhibits no edema.  No edema in legs.  Neurological: He is alert and oriented to person, place, and time. Coordination normal.  Skin: Skin is warm and dry. He is not diaphoretic.  Psychiatric: He has a normal mood and affect. His behavior is normal.  Nursing note and vitals reviewed.      Assessment & Plan:  Insomnia - Plan: HYDROcodone-ibuprofen (VICOPROFEN) 7.5-200 MG per tablet, ALPRAZolam (XANAX) 1 MG tablet, DISCONTINUED: ALPRAZolam (XANAX) 1 MG tablet  Chronic pain in left  shoulder - Plan: HYDROcodone-ibuprofen (VICOPROFEN) 7.5-200 MG per tablet  Chronic hepatitis C without hepatic coma - Plan: HYDROcodone-ibuprofen (VICOPROFEN) 7.5-200 MG per tablet  Essential hypertension--- at this point we should assume he no longer has hypertension  Meds ordered this encounter  Medications  .  HYDROcodone-ibuprofen (VICOPROFEN) 7.5-200 MG per tablet    Sig: Take 1 tablet by mouth every 8 (eight) hours as needed.    Dispense:  90 tablet    Refill:  0  . ALPRAZolam (XANAX) 1 MG tablet    Sig: Take 0.5-1 tablets (0.5-1 mg total) by mouth at bedtime as needed.    Dispense:  30 tablet    Refill:  5   Follow-up in 3-6 months for full exam with labs--we can postpone this until he has finished his treatment for hepatitis C  I have completed the patient encounter in its entirety as documented by the scribe, with editing by me where necessary. Artemisa Sladek P. Laney Pastor, M.D.

## 2014-11-24 ENCOUNTER — Ambulatory Visit (INDEPENDENT_AMBULATORY_CARE_PROVIDER_SITE_OTHER): Payer: 59 | Admitting: Emergency Medicine

## 2014-11-24 VITALS — BP 126/82 | HR 65 | Temp 97.9°F | Resp 18 | Ht 75.0 in | Wt 209.0 lb

## 2014-11-24 DIAGNOSIS — R1033 Periumbilical pain: Secondary | ICD-10-CM | POA: Diagnosis not present

## 2014-11-24 NOTE — Patient Instructions (Signed)
Ventral Hernia A ventral hernia (also called an incisional hernia) is a hernia that occurs at the site of a previous surgical cut (incision) in the abdomen. The abdominal wall spans from your lower chest down to your pelvis. If the abdominal wall is weakened from a surgical incision, a hernia can occur. A hernia is a bulge of bowel or muscle tissue pushing out on the weakened part of the abdominal wall. Ventral hernias can get bigger from straining or lifting. Obese and older people are at higher risk for a ventral hernia. People who develop infections after surgery or require repeat incisions at the same site on the abdomen are also at increased risk. CAUSES  A ventral hernia occurs because of weakness in the abdominal wall at an incision site.  SYMPTOMS  Common symptoms include:  A visible bulge or lump on the abdominal wall.  Pain or tenderness around the lump.  Increased discomfort if you cough or make a sudden movement. If the hernia has blocked part of the intestine, a serious complication can occur (incarcerated or strangulated hernia). This can become a problem that requires emergency surgery because the blood flow to the blocked intestine may be cut off. Symptoms may include:  Feeling sick to your stomach (nauseous).  Throwing up (vomiting).  Stomach swelling (distention) or bloating.  Fever.  Rapid heartbeat. DIAGNOSIS  Your health care provider will take a medical history and perform a physical exam. Various tests may be ordered, such as:  Blood tests.  Urine tests.  Ultrasonography.  X-rays.  Computed tomography (CT). TREATMENT  Watchful waiting may be all that is needed for a smaller hernia that does not cause symptoms. Your health care provider may recommend the use of a supportive belt (truss) that helps to keep the abdominal wall intact. For larger hernias or those that cause pain, surgery to repair the hernia is usually recommended. If a hernia becomes  strangulated, emergency surgery needs to be done right away. HOME CARE INSTRUCTIONS  Avoid putting pressure or strain on the abdominal area.  Avoid heavy lifting.  Use good body positioning for physical tasks. Ask your health care provider about proper body positioning.  Use a supportive belt as directed by your health care provider.  Maintain a healthy weight.  Eat foods that are high in fiber, such as whole grains, fruits, and vegetables. Fiber helps prevent difficult bowel movements (constipation).  Drink enough fluids to keep your urine clear or pale yellow.  Follow up with your health care provider as directed. SEEK MEDICAL CARE IF:   Your hernia seems to be getting larger or more painful. SEEK IMMEDIATE MEDICAL CARE IF:   You have abdominal pain that is sudden and sharp.  Your pain becomes severe.  You have repeated vomiting.  You are sweating a lot.  You notice a rapid heartbeat.  You develop a fever. MAKE SURE YOU:   Understand these instructions.  Will watch your condition.  Will get help right away if you are not doing well or get worse. Document Released: 07/17/2012 Document Revised: 12/15/2013 Document Reviewed: 07/17/2012 [facility] Patient Information 2015 [place], [place]. This information is not intended to replace advice given to you by your health care provider. Make sure you discuss any questions you have with your health care provider.

## 2014-11-24 NOTE — Progress Notes (Signed)
[facility] [address] [place] [address] [phone]  Date:  11/24/2014   Name:  Tanner Webb   DOB:  1950/09/25   MRN:  [id number]  PCP:  Kennon Portela, MD    Chief Complaint: Abdominal Pain   History of Present Illness:  Tanner Webb is a 64 y.o. very pleasant male patient who presents with the following:  History of ventral hernia repair with 8 individual hernias last year. He moved some heavy items at his mother's house Sunday On Monday afternoon he developed an intermittent, sharp pain in upper abdomen Now improved No fever or chills No nausea or vomiting.  No stool change Normal appetite. No improvement with over the counter medications or other home remedies. Denies other complaint or health concern today.   Patient Active Problem List   Diagnosis Date Noted  . Incarcerated ventral hernia 06/10/2014  . Smoker 06/08/2014  . Hepatitis C 10/06/2012  . Chronic pain in left shoulder 03/30/2012  . Anemia 03/30/2012    Past Medical History  Diagnosis Date  . Chronic pain in left shoulder   . Hyperlipidemia   . Hepatitis C infection     Past Surgical History  Procedure Laterality Date  . Cholecystectomy    . Splenectomy    . Ventral hernia repair  06/10/2014  . Laparotomy N/A 06/10/2014    Procedure: EXPLORATORY LAPAROTOMY, ventral hernia repair;  Surgeon: Donnie Mesa, MD;  Location: [facility];  Service: General;  Laterality: N/A;    History  Substance Use Topics  . Smoking status: Former Smoker    Quit date: 04/30/2014  . Smokeless tobacco: Never Used  . Alcohol Use: No    History reviewed. No pertinent family history.  Allergies  Allergen Reactions  . Penicillins Swelling and Other (See Comments)    "leshions in my mouth"  . Sulfa Antibiotics Swelling    Medication list has been reviewed and updated.  Current Outpatient Prescriptions on File Prior to Visit  Medication Sig Dispense Refill  . ALPRAZolam  (XANAX) 1 MG tablet Take 0.5-1 tablets (0.5-1 mg total) by mouth at bedtime as needed. 30 tablet 5  . Ledipasvir-Sofosbuvir 90-400 MG TABS Take by mouth.    Marland Kitchen amitriptyline (ELAVIL) 100 MG tablet Take 100 mg by mouth.    Marland Kitchen HYDROcodone-ibuprofen (VICOPROFEN) 7.5-200 MG per tablet Take 1 tablet by mouth every 8 (eight) hours as needed. (Patient not taking: Reported on 11/24/2014) 90 tablet 0  . methocarbamol (ROBAXIN-750) 750 MG tablet Take 1 tablet (750 mg total) by mouth 4 (four) times daily. (Patient not taking: Reported on 11/24/2014) 40 tablet 1  . oxyCODONE (OXY IR/ROXICODONE) 5 MG immediate release tablet Take 1-2 tablets (5-10 mg total) by mouth every 6 (six) hours as needed for moderate pain, severe pain or breakthrough pain. (Patient not taking: Reported on 11/24/2014) 30 tablet 0   No current facility-administered medications on file prior to visit.    Review of Systems:  As per HPI, otherwise negative.    Physical Examination: Filed Vitals:   11/24/14 0930  BP: 126/82  Pulse: 65  Temp: 97.9 F (36.6 C)  Resp: 18   Filed Vitals:   11/24/14 0930  Height: 6\' 3"  (1.905 m)  Weight: 209 lb (94.802 kg)   Body mass index is 26.12 kg/(m^2). Ideal Body Weight: Weight in (lb) to have BMI = 25: 199.6  GEN: WDWN, NAD, Non-toxic, A & O x 3 HEENT: Atraumatic, Normocephalic. Neck supple. No  masses, No LAD. Ears and Nose: No external deformity. CV: RRR, No M/G/R. No JVD. No thrill. No extra heart sounds. PULM: CTA B, no wheezes, crackles, rhonchi. No retractions. No resp. distress. No accessory muscle use. ABD: S, NT, ND, +BS. No rebound. No HSM. EXTR: No c/c/e NEURO Normal gait.  PSYCH: Normally interactive. Conversant. Not depressed or anxious appearing.  Calm demeanor.    Assessment and Plan: Musculoskeletal pain abdominal wall  Signed,  Ellison Carwin, MD

## 2014-11-27 ENCOUNTER — Other Ambulatory Visit: Payer: Self-pay | Admitting: Internal Medicine

## 2015-03-30 ENCOUNTER — Ambulatory Visit (INDEPENDENT_AMBULATORY_CARE_PROVIDER_SITE_OTHER): Payer: 59 | Admitting: Internal Medicine

## 2015-03-30 VITALS — BP 150/82 | HR 70 | Temp 98.1°F | Resp 18 | Ht 75.5 in | Wt 218.0 lb

## 2015-03-30 DIAGNOSIS — G8929 Other chronic pain: Secondary | ICD-10-CM

## 2015-03-30 DIAGNOSIS — M25512 Pain in left shoulder: Secondary | ICD-10-CM

## 2015-03-30 DIAGNOSIS — G4700 Insomnia, unspecified: Secondary | ICD-10-CM | POA: Diagnosis not present

## 2015-03-30 DIAGNOSIS — B182 Chronic viral hepatitis C: Secondary | ICD-10-CM | POA: Diagnosis not present

## 2015-03-30 MED ORDER — HYDROCODONE-IBUPROFEN 7.5-200 MG PO TABS: 1.0000 | ORAL_TABLET | Freq: Three times a day (TID) | ORAL | Status: DC | PRN

## 2015-03-30 MED ORDER — AMLODIPINE BESYLATE 5 MG PO TABS: 5.0000 mg | ORAL_TABLET | Freq: Every day | ORAL | Status: DC

## 2015-03-30 MED ORDER — CLONAZEPAM 1 MG PO TABS: 1.0000 mg | ORAL_TABLET | Freq: Every day | ORAL | Status: DC
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
## 2015-04-01 ENCOUNTER — Telehealth: Payer: Self-pay | Admitting: Family Medicine

## 2015-04-01 NOTE — Telephone Encounter (Signed)
appt cpe at 104 with any available provider former dr guest patient I lmom for patient to give [place] a call to set appt up

## 2015-04-21 ENCOUNTER — Encounter: Payer: 59 | Admitting: Internal Medicine

## 2015-05-07 ENCOUNTER — Observation Stay (HOSPITAL_COMMUNITY)
Admission: EM | Admit: 2015-05-07 | Discharge: 2015-05-08 | Disposition: A | Discharge status: Home / Self Care | Payer: 59 | Attending: Internal Medicine | Admitting: Internal Medicine

## 2015-05-07 ENCOUNTER — Emergency Department (HOSPITAL_COMMUNITY): Payer: 59

## 2015-05-07 ENCOUNTER — Ambulatory Visit (INDEPENDENT_AMBULATORY_CARE_PROVIDER_SITE_OTHER): Payer: 59 | Admitting: Family Medicine

## 2015-05-07 ENCOUNTER — Encounter (HOSPITAL_COMMUNITY): Payer: Self-pay

## 2015-05-07 VITALS — BP 130/70 | HR 60 | Temp 97.4°F | Resp 16 | Ht 75.0 in | Wt 223.0 lb

## 2015-05-07 DIAGNOSIS — R072 Precordial pain: Secondary | ICD-10-CM

## 2015-05-07 DIAGNOSIS — B1920 Unspecified viral hepatitis C without hepatic coma: Secondary | ICD-10-CM | POA: Insufficient documentation

## 2015-05-07 DIAGNOSIS — G8929 Other chronic pain: Secondary | ICD-10-CM | POA: Diagnosis not present

## 2015-05-07 DIAGNOSIS — R0789 Other chest pain: Secondary | ICD-10-CM

## 2015-05-07 DIAGNOSIS — R079 Chest pain, unspecified: Secondary | ICD-10-CM

## 2015-05-07 DIAGNOSIS — E785 Hyperlipidemia, unspecified: Secondary | ICD-10-CM | POA: Insufficient documentation

## 2015-05-07 DIAGNOSIS — I10 Essential (primary) hypertension: Secondary | ICD-10-CM | POA: Insufficient documentation

## 2015-05-07 DIAGNOSIS — F419 Anxiety disorder, unspecified: Secondary | ICD-10-CM | POA: Insufficient documentation

## 2015-05-07 DIAGNOSIS — Z79891 Long term (current) use of opiate analgesic: Secondary | ICD-10-CM | POA: Insufficient documentation

## 2015-05-07 DIAGNOSIS — M25512 Pain in left shoulder: Secondary | ICD-10-CM | POA: Insufficient documentation

## 2015-05-07 DIAGNOSIS — Z87891 Personal history of nicotine dependence: Secondary | ICD-10-CM | POA: Diagnosis not present

## 2015-05-07 DIAGNOSIS — I4510 Unspecified right bundle-branch block: Secondary | ICD-10-CM

## 2015-05-07 DIAGNOSIS — G4700 Insomnia, unspecified: Secondary | ICD-10-CM

## 2015-05-07 DIAGNOSIS — Z79899 Other long term (current) drug therapy: Secondary | ICD-10-CM | POA: Diagnosis not present

## 2015-05-07 DIAGNOSIS — Z791 Long term (current) use of non-steroidal anti-inflammatories (NSAID): Secondary | ICD-10-CM | POA: Diagnosis not present

## 2015-05-07 HISTORY — DX: Essential (primary) hypertension: I10

## 2015-05-07 HISTORY — DX: Unspecified right bundle-branch block: I45.10

## 2015-05-07 HISTORY — DX: Other chest pain: R07.89

## 2015-05-07 LAB — CBC
HCT: 43.8 % (ref 39.0–52.0)
Hemoglobin: 14.8 g/dL (ref 13.0–17.0)
MCH: 31.3 pg (ref 26.0–34.0)
MCHC: 33.8 g/dL (ref 30.0–36.0)
MCV: 92.6 fL (ref 78.0–100.0)
PLATELETS: 363 10*3/uL (ref 150–400)
RBC: 4.73 MIL/uL (ref 4.22–5.81)
RDW: 13.4 % (ref 11.5–15.5)
WBC: 9.2 10*3/uL (ref 4.0–10.5)

## 2015-05-07 LAB — COMPREHENSIVE METABOLIC PANEL
ALK PHOS: 57 U/L (ref 38–126)
ALT: 20 U/L (ref 17–63)
AST: 21 U/L (ref 15–41)
Albumin: 3.9 g/dL (ref 3.5–5.0)
Anion gap: 5 (ref 5–15)
BUN: 10 mg/dL (ref 6–20)
CALCIUM: 9.2 mg/dL (ref 8.9–10.3)
CHLORIDE: 108 mmol/L (ref 101–111)
CO2: 29 mmol/L (ref 22–32)
CREATININE: 0.95 mg/dL (ref 0.61–1.24)
GFR calc non Af Amer: >60 mL/min (ref >60)
Glucose, Bld: 110 mg/dL — ABNORMAL HIGH (ref 65–99)
Potassium: 4.4 mmol/L (ref 3.5–5.1)
SODIUM: 142 mmol/L (ref 135–145)
Total Bilirubin: 0.6 mg/dL (ref 0.3–1.2)
Total Protein: 6.9 g/dL (ref 6.5–8.1)

## 2015-05-07 LAB — D-DIMER, QUANTITATIVE (NOT AT ARMC)

## 2015-05-07 LAB — CBC WITH DIFFERENTIAL/PLATELET
BASOS ABS: 0.1 10*3/uL (ref 0.0–0.1)
Basophils Relative: 1 %
Eosinophils Absolute: 0.2 10*3/uL (ref 0.0–0.7)
Eosinophils Relative: 2 %
HCT: 42.4 % (ref 39.0–52.0)
HEMOGLOBIN: 14.1 g/dL (ref 13.0–17.0)
LYMPHS ABS: 2.3 10*3/uL (ref 0.7–4.0)
LYMPHS PCT: 29 %
MCH: 31 pg (ref 26.0–34.0)
MCHC: 33.3 g/dL (ref 30.0–36.0)
MCV: 93.2 fL (ref 78.0–100.0)
Monocytes Absolute: 0.8 10*3/uL (ref 0.1–1.0)
Monocytes Relative: 10 %
NEUTROS PCT: 58 %
Neutro Abs: 4.5 10*3/uL (ref 1.7–7.7)
Platelets: 378 10*3/uL (ref 150–400)
RBC: 4.55 MIL/uL (ref 4.22–5.81)
RDW: 13.5 % (ref 11.5–15.5)
WBC: 7.8 10*3/uL (ref 4.0–10.5)

## 2015-05-07 LAB — TROPONIN I
Troponin I: <0.03 ng/mL (ref <0.031)
Troponin I: <0.03 ng/mL (ref <0.031)

## 2015-05-07 LAB — CREATININE, SERUM: CREATININE: 0.87 mg/dL (ref 0.61–1.24)

## 2015-05-07 LAB — TSH: TSH: 1.727 u[IU]/mL (ref 0.350–4.500)

## 2015-05-07 MED ORDER — ACETAMINOPHEN 325 MG PO TABS: 650.0000 mg | ORAL_TABLET | ORAL | Status: DC | PRN

## 2015-05-07 MED ORDER — HYDROCODONE-IBUPROFEN 7.5-200 MG PO TABS: 1.0000 | ORAL_TABLET | Freq: Three times a day (TID) | ORAL | Status: DC | PRN

## 2015-05-07 MED ORDER — ENOXAPARIN SODIUM 40 MG/0.4ML ~~LOC~~ SOLN
40.0000 mg | SUBCUTANEOUS | Status: DC
  Filled 2015-05-07: qty 0.4

## 2015-05-07 MED ORDER — CLONAZEPAM 0.5 MG PO TABS
1.0000 mg | ORAL_TABLET | Freq: Every day | ORAL | Status: DC
  Administered 2015-05-07: 1 mg via ORAL
  Filled 2015-05-07: qty 2

## 2015-05-07 MED ORDER — ONDANSETRON HCL 4 MG/2ML IJ SOLN: 4.0000 mg | Freq: Four times a day (QID) | INTRAMUSCULAR | Status: DC | PRN

## 2015-05-07 MED ORDER — ATORVASTATIN CALCIUM 40 MG PO TABS
40.0000 mg | ORAL_TABLET | Freq: Every day | ORAL | Status: DC
  Administered 2015-05-07: 40 mg via ORAL
  Filled 2015-05-07: qty 1

## 2015-05-07 MED ORDER — SODIUM CHLORIDE 0.9 % IV SOLN
INTRAVENOUS | Status: DC
  Administered 2015-05-07: 11:00:00 via INTRAVENOUS

## 2015-05-07 MED ORDER — ASPIRIN 81 MG PO CHEW
324.0000 mg | CHEWABLE_TABLET | Freq: Once | ORAL | Status: AC
  Administered 2015-05-07: 324 mg via ORAL

## 2015-05-07 MED ORDER — AMLODIPINE BESYLATE 5 MG PO TABS
5.0000 mg | ORAL_TABLET | Freq: Every day | ORAL | Status: DC
  Administered 2015-05-07 – 2015-05-08 (×2): 5 mg via ORAL
  Filled 2015-05-07 (×2): qty 1

## 2015-05-07 MED ORDER — ASPIRIN EC 81 MG PO TBEC
81.0000 mg | DELAYED_RELEASE_TABLET | Freq: Every day | ORAL | Status: DC
  Administered 2015-05-08: 81 mg via ORAL
  Filled 2015-05-07: qty 1

## 2015-05-07 MED ORDER — NITROGLYCERIN 0.4 MG SL SUBL: 0.4000 mg | SUBLINGUAL_TABLET | SUBLINGUAL | Status: DC | PRN

## 2015-05-07 MED ORDER — NITROGLYCERIN 0.4 MG SL SUBL
0.4000 mg | SUBLINGUAL_TABLET | SUBLINGUAL | Status: DC | PRN
  Administered 2015-05-07 (×2): 0.4 mg via SUBLINGUAL

## 2015-05-07 MED ORDER — HYDROCODONE-ACETAMINOPHEN 7.5-325 MG PO TABS: 1.0000 | ORAL_TABLET | Freq: Four times a day (QID) | ORAL | Status: DC | PRN

## 2015-05-07 NOTE — ED Notes (Signed)
MD Allen at the bedside.  

## 2015-05-07 NOTE — ED Notes (Signed)
Attempted Report x1.   

## 2015-05-07 NOTE — ED Notes (Signed)
Patient returned from X-ray 

## 2015-05-07 NOTE — Progress Notes (Signed)
05/07/2015 at 8:46 AM  Tanner Webb / DOB: 02/19/1951 / MRN: [id number]  The patient has Chronic pain in left shoulder; Anemia; Hepatitis C; Smoker; and Incarcerated ventral hernia on his problem list.  SUBJECTIVE  Tanner Webb is a 64 y.o. well appearing male presenting for the chief complaint of right sided chest pain with radiation to his right upper back that started last Friday.  Complains of some shortness of breath with the initial episode and "had to sit down due to the pain."  Denies nausea, presyncope and diaphoresis with the pain. Also denies and exertional component to the pain.  Associates some occasional left sided radiation.  Has a history of HTN now treated with Norvasc, which he started 1 month ago.  Before the medication reports BP's average 155/90.  Chart review reveals a lipid panel with normal lipids 1 year ago.  Has a 12 pack year history of smoking. He is a non diabetic.     He  has a past medical history of Chronic pain in left shoulder; Hyperlipidemia; and Hepatitis C infection.    Medications reviewed and updated by myself where necessary, and exist elsewhere in the encounter.   Tanner Webb is allergic to penicillins and sulfa antibiotics. He  reports that he quit smoking about a year ago. He has never used smokeless tobacco. He reports that he does not drink alcohol or use illicit drugs. He  reports that he currently engages in sexual activity. The patient  has past surgical history that includes Cholecystectomy; Splenectomy; Ventral hernia repair (06/10/2014); and laparotomy (N/A, 06/10/2014).  His family history is not on file.  Review of Systems  Cardiovascular: Positive for chest pain. Negative for leg swelling.    OBJECTIVE  His  height is 6\' 3"  (1.905 m) and weight is 223 lb (101.152 kg). His oral temperature is 97.4 F (36.3 C). His blood pressure is 130/70 and his pulse is 60. His respiration is 16 and oxygen saturation is 98%.  The patient's body  mass index is 27.87 kg/(m^2).  Physical Exam  Vitals reviewed. Constitutional: He is oriented to person, place, and time. He appears well-developed. No distress.  Eyes: EOM are normal. Pupils are equal, round, and reactive to light. No scleral icterus.  Neck: Normal range of motion.  Cardiovascular: Normal rate and regular rhythm.   Respiratory: Effort normal and breath sounds normal.    GI: Soft. He exhibits no distension and no mass. There is no tenderness. There is no rebound and no guarding.  Musculoskeletal: Normal range of motion.  Neurological: He is alert and oriented to person, place, and time. No cranial nerve deficit.  Skin: Skin is warm and dry. No rash noted. He is not diaphoretic.  Psychiatric: He has a normal mood and affect.    EKG: New T wave inversion in lead 1 and III along with new right bundle branch block when compared to 8.28.15.   No results found for this or any previous visit (from the past 24 hour(s)).  ASSESSMENT & PLAN  Tanner Webb was seen today for chest pain.  Diagnoses and all orders for this visit:  Other chest pain -     EKG 12-Lead -     aspirin chewable tablet 324 mg; Chew 4 tablets (324 mg total) by mouth once.  Right bundle branch block (RBBB)    The patient was advised to call or come back to clinic if he does not see an improvement in symptoms, or worsens with  the above plan.   Philis Fendt, MHS, PA-C [facility] 05/07/2015 8:46 AM

## 2015-05-07 NOTE — ED Notes (Addendum)
Per EMS, Last Friday at 2100 patient started to have a sudden onset of chest pain on the right side that radiated to his back that has intermittently continued throughout the week. Denies Nausea, Vomiting, Diaphoresis. 3/10 Chest Pain today at PCP. Patient was given 324 mg of Aspirin at PCP today and pain was relieved. PCP reports new onset of RBB with Bradycardia. BP: 152/88, 50 HR, 98% on RA. Pt has HX of Anxiety, Rotator Cuff Surgery, HTN.

## 2015-05-07 NOTE — H&P (Signed)
Patient ID: Tanner Webb MRN: [id number], DOB/AGE: 1950-12-07   Admit date: 05/07/2015   Primary Physician: Kennon Portela, MD Primary Cardiologist: New  Pt. Profile:  Tanner Webb is a 63 y.o. male with a history of hypertension, hyperlipidemia, former tobacco smoker, anxiety, hepatitis C infection who presented to [facility] from his PCP by EMS for evaluation of chest pain and new RBBB.   HPI: Hx as above. Last Friday, 04/30/15, patient had developed a right-sided chest pain. He described the pain as a pressure 6-7/10  that radiated to his back while standing. He set down and his pressure resolved in 5 minutes. Since then he has been having intermittent chest pain. Sometimes his chest pain on right side and sometimes on the left side. Sometimes pain alleviates with certain body position. He denies any exertional shortness of breath. He denies nausea or vomiting. This morning he went to his primary care provider for worrisome chest pain. His EKG shows new onset right bundle branch block. BP: 152/88, 50 HR, 98% on RA. He was given 324 mg of Aspirin and sent to the ED for evaluation of chest pain.  Currently he is having left-sided chest pain, 1-2/10. He describes as achy/tightness. He denies nausea, vomiting, diaphoresis, shortness of breath, extremity edema, orthopnea, PND, palpitations, dizziness, or syncope. He was given SL nitro x 2. POC trop negative. Lytes normal. D-dimer negative. Chest x-ray showed low volume without acute abnormality. EKG showed sinus bradycardia at rate of 49 and new RBBB.   He quit tobacco smoking last year. He smoked one to one and half pack a day for about 10 years. Currently he is smoking vapour. Never seen by any cardiologist. No prior history of echocardiogram or cardiac cath. Mother has dementia/ahlzheimer's. His uncle has had a couple of strokes.   Problem List  Past Medical History  Diagnosis Date  . Chronic pain in left shoulder   .  Hyperlipidemia   . Hepatitis C infection   . Hypertension     Past Surgical History  Procedure Laterality Date  . Cholecystectomy    . Splenectomy    . Ventral hernia repair  06/10/2014  . Laparotomy N/A 06/10/2014    Procedure: EXPLORATORY LAPAROTOMY, ventral hernia repair;  Surgeon: Donnie Mesa, MD;  Location: [place] [facility];  Service: General;  Laterality: N/A;     Allergies  Allergies  Allergen Reactions  . Penicillins Swelling and Other (See Comments)    "leshions in my mouth"  . Sulfa Antibiotics Swelling     Home Medications  Prior to Admission medications   Medication Sig Start Date End Date Taking? Authorizing Provider  ALPRAZolam Duanne Moron) 1 MG tablet Take 0.5-1 tablets (0.5-1 mg total) by mouth at bedtime as needed. Patient taking differently: Take 1 mg by mouth at bedtime as needed for sleep.  11/15/14  Yes Leandrew Koyanagi, MD  amLODipine (NORVASC) 5 MG tablet Take 1 tablet (5 mg total) by mouth daily. Patient taking differently: Take 5 mg by mouth at bedtime.  03/30/15  Yes Leandrew Koyanagi, MD  clonazePAM (KLONOPIN) 1 MG tablet Take 1 tablet (1 mg total) by mouth at bedtime. Patient taking differently: Take 1 mg by mouth at bedtime as needed (sleep).  03/30/15  Yes Leandrew Koyanagi, MD  HYDROcodone-ibuprofen (VICOPROFEN) 7.5-200 MG per tablet Take 1 tablet by mouth every 8 (eight) hours as needed. Patient taking differently: Take 1 tablet by mouth every 8 (eight) hours as needed for moderate pain.  03/30/15  Yes Leandrew Koyanagi, MD    Family History  No family history on file. Family Status  Relation Status Death Age  . Mother Alive   . Father Deceased   . Daughter Alive   . Maternal Grandmother Deceased   . Maternal Grandfather Deceased   . Paternal Grandmother Deceased   . Paternal Grandfather Deceased   . Daughter Alive      Social History  Social History   Social History  . Marital Status: Single    Spouse Name: N/A  . Number of Children: N/A    . Years of Education: N/A   Occupational History  . Not on file.   Social History Main Topics  . Smoking status: Former Smoker    Quit date: 04/30/2014  . Smokeless tobacco: Never Used  . Alcohol Use: No  . Drug Use: No  . Sexual Activity: Yes   Other Topics Concern  . Not on file   Social History Narrative     All other systems reviewed and are otherwise negative except as noted above.  Physical Exam  Blood pressure 132/72, pulse 50, temperature 98.3 F (36.8 C), temperature source Oral, resp. rate 16, SpO2 97 %.  General: Pleasant, NAD Psych: Normal affect. Neuro: Alert and oriented X 3. Moves all extremities spontaneously. HEENT: Normal  Neck: Supple without bruits or JVD. Lungs:  Resp regular and unlabored, CTA. Heart: RRR no s3, s4, or murmurs. Abdomen: Soft, non-tender, non-distended, BS + x 4.  Extremities: No clubbing, cyanosis or edema. DP/PT/Radials 2+ and equal bilaterally.   Recent Labs  05/07/15 1024  TROPONINI <0.03   Lab Results  Component Value Date   WBC 7.8 05/07/2015   HGB 14.1 05/07/2015   HCT 42.4 05/07/2015   MCV 93.2 05/07/2015   PLT 378 05/07/2015    Recent Labs Lab 05/07/15 1024  NA 142  K 4.4  CL 108  CO2 29  BUN 10  CREATININE 0.95  CALCIUM 9.2  PROT 6.9  BILITOT 0.6  ALKPHOS 57  ALT 20  AST 21  GLUCOSE 110*   Lab Results  Component Value Date   CHOL 151 12/29/2013   HDL 57 12/29/2013   LDLCALC 81 12/29/2013   TRIG 66 12/29/2013   Lab Results  Component Value Date   DDIMER <0.27 05/07/2015     Radiology/Studies  Dg Chest 2 View  05/07/2015   CLINICAL DATA:  Initial encounter for 1 week history of chest pain  EXAM: CHEST  2 VIEW  COMPARISON:  06/12/2014.  FINDINGS: Lung volumes are low. Cardiopericardial silhouette is at upper limits of normal for size. The lungs are clear without focal pneumonia, edema, pneumothorax or pleural effusion. Pneumoperitoneum seen of the right hemidiaphragm on the previous study  has resolved in the interval. Old right rib fractures noted.  IMPRESSION: Low volume film without acute cardiopulmonary findings.   Electronically Signed   By: Misty Stanley M.D.   On: 05/07/2015 10:51    ECG  Vent. rate 49 BPM PR interval 204 ms QRS duration 142 ms QT/QTc 457/412 ms P-R-T axes 3 12 19   ASSESSMENT AND PLAN  1. Atypical chest pain - His chest pain pain is worrisome with some typical and atypical features. His chest pain somewhat improved on sublingual nitroglycerin.  - POC trop negative. Lytes normal. D-dimer negative. Chest x-ray showed low volume without acute abnormality. EKG showed sinus bradycardia at rate of 49 and new RBBB. NPO after midnight and Myoview in AM. -  Will admit to telemtry for observation -Cycle Troponin and serial ECGs - Will get TSH, lipid panel - Differential include musculoskeletal due to positional chest pain, however pain is not reproducible with palpation. No injury.  2. 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 - Will get echocardiogram.  3. HTN - BP was 152/88 at PCP office. Stable here. - Continue home dose of Norvasc 5mg .  4. HL - Not on any medications. Will get lipid panel. LDL 81 12/29/2013.  5. Anxiety - At home, on PRN xanax and klonopin.  6. Former tobacco smoker - Quit last year. Currently smoking vapors.   Signed, Leanor Kail, PA-C 05/07/2015, 1:19 PM Pager [phone]  Patient seen and examined with Vin Bhagat, PA-C. We discussed all aspects of the encounter. I agree with the assessment and plan as stated above. He has multiple CRFs but CP has typical and atypical features. Troponin negative. ECG with intermittent RBBB. Agree with admission for R/O MI and proceed with echo and stress testing.   Bensimhon, Daniel,MD 2:16 PM

## 2015-05-07 NOTE — ED Notes (Signed)
Cardiology at the bedside.

## 2015-05-07 NOTE — ED Notes (Signed)
Cardiology at bedside.

## 2015-05-07 NOTE — ED Provider Notes (Signed)
CSN: [id number]     Arrival date & time 05/07/15  7096 History   First MD Initiated Contact with Patient 05/07/15 [phone]     Chief Complaint  Patient presents with  . Chest Pain  . Abnormal ECG     (Consider location/radiation/quality/duration/timing/severity/associated sxs/prior Treatment) HPI Comments: Patient here complaining of right-sided chest pain has been intermittent for several months and worse for the past week. Patient describes the pain as a pressure lasting approximately 4-5 minutes without associated dyspnea or diaphoresis. No fever or chills. No leg pain or swelling. No pleuritic component to this. Went to his doctor's office today and had EKG which showed a new right bundle branch block and he was sent here for further management. He denies any prior history of coronary artery disease  Patient is a 64 y.o. male presenting with chest pain. The history is provided by the patient.  Chest Pain   Past Medical History  Diagnosis Date  . Chronic pain in left shoulder   . Hyperlipidemia   . Hepatitis C infection   . Hypertension    Past Surgical History  Procedure Laterality Date  . Cholecystectomy    . Splenectomy    . Ventral hernia repair  06/10/2014  . Laparotomy N/A 06/10/2014    Procedure: EXPLORATORY LAPAROTOMY, ventral hernia repair;  Surgeon: Donnie Mesa, MD;  Location: [place];  Service: General;  Laterality: N/A;   No family history on file. Social History  Substance Use Topics  . Smoking status: Former Smoker    Quit date: 04/30/2014  . Smokeless tobacco: Never Used  . Alcohol Use: No    Review of Systems  Cardiovascular: Positive for chest pain.  All other systems reviewed and are negative.     Allergies  Penicillins and Sulfa antibiotics  Home Medications   Prior to Admission medications   Medication Sig Start Date End Date Taking? Authorizing Provider  ALPRAZolam Duanne Moron) 1 MG tablet Take 0.5-1 tablets (0.5-1 mg total) by mouth at bedtime as  needed. 11/15/14   Leandrew Koyanagi, MD  amitriptyline (ELAVIL) 100 MG tablet Take 100 mg by mouth. 06/06/13   Historical Provider, MD  amLODipine (NORVASC) 5 MG tablet Take 1 tablet (5 mg total) by mouth daily. 03/30/15   Leandrew Koyanagi, MD  clonazePAM (KLONOPIN) 1 MG tablet Take 1 tablet (1 mg total) by mouth at bedtime. 03/30/15   Leandrew Koyanagi, MD  HYDROcodone-ibuprofen (VICOPROFEN) 7.5-200 MG per tablet Take 1 tablet by mouth every 8 (eight) hours as needed. 03/30/15   Leandrew Koyanagi, MD  Ledipasvir-Sofosbuvir 90-400 MG TABS Take by mouth. 09/30/14   Historical Provider, MD   BP 138/66 mmHg  Pulse 50  Temp(Src) 98.3 F (36.8 C) (Oral)  Resp 16  SpO2 100% Physical Exam  Constitutional: He is oriented to person, place, and time. He appears well-developed and well-nourished.  Non-toxic appearance. No distress.  HENT:  Head: Normocephalic and atraumatic.  Eyes: Conjunctivae, EOM and lids are normal. Pupils are equal, round, and reactive to light.  Neck: Normal range of motion. Neck supple. No tracheal deviation present. No thyroid mass present.  Cardiovascular: Normal rate, regular rhythm and normal heart sounds.  Exam reveals no gallop.   No murmur heard. Pulmonary/Chest: Effort normal and breath sounds normal. No stridor. No respiratory distress. He has no decreased breath sounds. He has no wheezes. He has no rhonchi. He has no rales.  Abdominal: Soft. Normal appearance and bowel sounds are normal. He exhibits no distension.  There is no tenderness. There is no rebound and no CVA tenderness.  Musculoskeletal: Normal range of motion. He exhibits no edema or tenderness.  Neurological: He is alert and oriented to person, place, and time. He has normal strength. No cranial nerve deficit or sensory deficit. GCS eye subscore is 4. GCS verbal subscore is 5. GCS motor subscore is 6.  Skin: Skin is warm and dry. No abrasion and no rash noted.  Psychiatric: He has a normal mood and affect.  His speech is normal and behavior is normal.  Nursing note and vitals reviewed.   ED Course  Procedures (including critical care time) Labs Review Labs Reviewed  CBC WITH DIFFERENTIAL/PLATELET  COMPREHENSIVE METABOLIC PANEL  TROPONIN I    Imaging Review No results found. I have personally reviewed and evaluated these images and lab results as part of my medical decision-making.   EKG Interpretation   Date/Time:  Friday May 07 2015 09:42:44 EDT Ventricular Rate:  49 PR Interval:  204 QRS Duration: 142 QT Interval:  457 QTC Calculation: 412 R Axis:   12 Text Interpretation:  Sinus bradycardia Right bundle branch block rbbb new  from prior ecg Confirmed by ALLEN  MD, ANTHONY ([id number]) on 05/07/2015  10:24:53 AM      MDM   Final diagnoses:  Chest pain    Pt to be admitted by cards for eval of chest pain    Lacretia Leigh, MD 05/11/15 1249

## 2015-05-07 NOTE — ED Notes (Signed)
Patient to be transported upstairs by [place], NT

## 2015-05-07 NOTE — Progress Notes (Signed)
Patient ID: Tanner Webb, male    DOB: [birth year]  Age: 64 y.o. MRN: [id number]  Chief Complaint  Patient presents with  . Chest Pain    x 1 week    Subjective:   64 year old man who stopped smoking a year ago. He came in here today with history of having had chest pain a week ago. It is persisted off and on since then. He has been primarily just to the right of the sternum. He has not had any major shortness of breath. It has been worrying him. He drove here one day and it was busy so he did not come in. He finally came in early this morning because of the continued symptoms. He has not had any heart disease in the past. He does still find it even though he no longer smokes.  Current allergies, medications, problem list, past/family and social histories reviewed.  Objective:  BP 130/70 mmHg  Pulse 60  Temp(Src) 97.4 F (36.3 C) (Oral)  Resp 16  Ht 6\' 3"  (1.905 m)  Wt 223 lb (101.152 kg)  BMI 27.87 kg/m2  SpO2 98%  EKG shows a new onset right bundle branch block pattern as compared to last October. He is alert and oriented and has some cough. Remaining exam was done by the PA as documented.  Assessment & Plan:   Assessment: 1. Other chest pain   2. Right bundle branch block (RBBB)       Plan: Discussed the chest pain and new onset right bundle branch block with the patient. He requires further evaluation and will be sent to the hospital for this. He will have an IV placed and has been given some aspirin.  Orders Placed This Encounter  Procedures  . EKG 12-Lead    Meds ordered this encounter  Medications  . aspirin chewable tablet 324 mg    Sig:    Sent by EMS.  Return if symptoms worsen or fail to improve.   HOPPER,DAVID, MD 05/07/2015

## 2015-05-08 ENCOUNTER — Observation Stay (HOSPITAL_COMMUNITY): Payer: 59

## 2015-05-08 ENCOUNTER — Encounter (HOSPITAL_COMMUNITY): Payer: Self-pay | Admitting: Nurse Practitioner

## 2015-05-08 ENCOUNTER — Other Ambulatory Visit: Payer: Self-pay | Admitting: Nurse Practitioner

## 2015-05-08 DIAGNOSIS — R0789 Other chest pain: Secondary | ICD-10-CM

## 2015-05-08 DIAGNOSIS — I4510 Unspecified right bundle-branch block: Secondary | ICD-10-CM

## 2015-05-08 DIAGNOSIS — E785 Hyperlipidemia, unspecified: Secondary | ICD-10-CM

## 2015-05-08 DIAGNOSIS — R079 Chest pain, unspecified: Secondary | ICD-10-CM | POA: Diagnosis not present

## 2015-05-08 DIAGNOSIS — I10 Essential (primary) hypertension: Secondary | ICD-10-CM

## 2015-05-08 LAB — BASIC METABOLIC PANEL
Anion gap: 6 (ref 5–15)
BUN: 14 mg/dL (ref 6–20)
CALCIUM: 9.1 mg/dL (ref 8.9–10.3)
CHLORIDE: 107 mmol/L (ref 101–111)
CO2: 25 mmol/L (ref 22–32)
CREATININE: 0.88 mg/dL (ref 0.61–1.24)
GFR calc non Af Amer: >60 mL/min (ref >60)
GLUCOSE: 111 mg/dL — AB (ref 65–99)
Potassium: 4.1 mmol/L (ref 3.5–5.1)
Sodium: 138 mmol/L (ref 135–145)

## 2015-05-08 LAB — NM MYOCAR MULTI W/SPECT W/WALL MOTION / EF
CHL CUP MPHR: 159 {beats}/min
CHL CUP RESTING HR STRESS: 56 {beats}/min
CSEPED: 0 min
CSEPEDS: 0 s
CSEPEW: 1 METS
CSEPPHR: 71 {beats}/min
Percent HR: 44 %

## 2015-05-08 LAB — LIPID PANEL
Cholesterol: 151 mg/dL (ref 0–200)
HDL: 46 mg/dL (ref >40)
LDL CALC: 87 mg/dL (ref 0–99)
Total CHOL/HDL Ratio: 3.3 RATIO
Triglycerides: 92 mg/dL (ref <150)
VLDL: 18 mg/dL (ref 0–40)

## 2015-05-08 LAB — TROPONIN I
Troponin I: <0.03 ng/mL (ref <0.031)
Troponin I: <0.03 ng/mL (ref <0.031)

## 2015-05-08 MED ORDER — TECHNETIUM TC 99M SESTAMIBI - CARDIOLITE
30.0000 | Freq: Once | INTRAVENOUS | Status: AC | PRN
  Administered 2015-05-08: 30 via INTRAVENOUS

## 2015-05-08 MED ORDER — TECHNETIUM TC 99M SESTAMIBI GENERIC - CARDIOLITE
10.0000 | Freq: Once | INTRAVENOUS | Status: AC | PRN
  Administered 2015-05-08: 10 via INTRAVENOUS

## 2015-05-08 MED ORDER — REGADENOSON 0.4 MG/5ML IV SOLN
0.4000 mg | Freq: Once | INTRAVENOUS | Status: AC
  Administered 2015-05-08: 0.4 mg via INTRAVENOUS
  Filled 2015-05-08: qty 5

## 2015-05-08 MED ORDER — ASPIRIN 81 MG PO TBEC: 81.0000 mg | DELAYED_RELEASE_TABLET | Freq: Every day | ORAL | Status: AC

## 2015-05-08 MED ORDER — REGADENOSON 0.4 MG/5ML IV SOLN
INTRAVENOUS | Status: AC
  Filled 2015-05-08: qty 5

## 2015-05-08 NOTE — Discharge Summary (Signed)
Discharge Summary   Patient ID: Tanner Webb,  MRN: [id number], DOB/AGE: [birth year] 64 y.o.  Admit date: 05/07/2015 Discharge date: 05/08/2015  Primary Care Provider: Kennon Portela Primary Cardiologist: Zandra Abts, MD   Discharge Diagnoses Principal Problem:   Atypical chest pain  **Negative Lexiscan Myoview this admission.  Active Problems:   Essential hypertension   Hyperlipidemia   Hepatitis C   Anxiety   Remote Tobacco Abuse/Current "Vape" usage  Allergies Allergies  Allergen Reactions  . Penicillins Swelling and Other (See Comments)    "leshions in my mouth"  . Sulfa Antibiotics Swelling    Procedures  Lexiscan Cardiolite 9.24.2016  IMPRESSION: 1. No reversible ischemia or infarction. 2. Normal left ventricular wall motion. 3. Left ventricular ejection fraction 56% 4. Low-risk stress test findings*. _____________  History of Present Illness  64 y/o male with a prior history of HTN, tobacco abuse, and hepatitis C.  He has no prior cardiac history.  He was in his usual state of health until approximately one week prior to admission when he began to develop intermittent right and or left-sided chest pain and pressure radiating to his back.  He saw his PCP on 9/23 and ECG showed a new RBBB.  As a result, he was referred to the ED for further evaluation.  There, troponin was normal.  He was seen by cardiology and admitted for further evaluation.  Hospital Course  Tanner Webb ruled out for MI and had no further chest pain.  This AM, he underwent lexiscan cardiolite stress testing, which was negative for ischemia and showed normal LV function.  We will discharge Tanner Webb this afternoon and arrange for an outpatient 2D echocardiogram in the setting of a new RBBB, along with a f/u appointment in our [place] office.  Discharge Vitals Blood pressure 121/58, pulse 65, temperature 97.5 F (36.4 C), temperature source Oral, resp. rate 20, height 6\' 3"  (1.905 m),  weight 216 lb 14.4 oz (98.385 kg), SpO2 100 %.  Filed Weights   05/07/15 1614 05/08/15 0437  Weight: 216 lb 0.8 oz (98 kg) 216 lb 14.4 oz (98.385 kg)    Labs  CBC  Recent Labs  05/07/15 1024 05/07/15 1704  WBC 7.8 9.2  NEUTROABS 4.5  --   HGB 14.1 14.8  HCT 42.4 43.8  MCV 93.2 92.6  PLT 378 638   Basic Metabolic Panel  Recent Labs  05/07/15 1024 05/07/15 1704 05/08/15 0418  NA 142  --  138  K 4.4  --  4.1  CL 108  --  107  CO2 29  --  25  GLUCOSE 110*  --  111*  BUN 10  --  14  CREATININE 0.95 0.87 0.88  CALCIUM 9.2  --  9.1   Liver Function Tests  Recent Labs  05/07/15 1024  AST 21  ALT 20  ALKPHOS 57  BILITOT 0.6  PROT 6.9  ALBUMIN 3.9   Cardiac Enzymes  Recent Labs  05/07/15 1704 05/07/15 2322 05/08/15 0418  TROPONINI <0.03 <0.03 <0.03   D-Dimer  Recent Labs  05/07/15 1024  DDIMER <0.27   Fasting Lipid Panel  Recent Labs  05/08/15 0418  CHOL 151  HDL 46  LDLCALC 87  TRIG 92  CHOLHDL 3.3   Thyroid Function Tests  Recent Labs  05/07/15 1704  TSH 1.727   Disposition  Pt is being discharged home today in good condition.  Follow-up Plans & Appointments      Follow-up Information  Follow up with GUEST, Veneda Melter, MD.   Specialty:  Internal Medicine   Contact information:   [place] [address] [phone]       Follow up with Carlyle Dolly, MD.   Specialty:  Cardiology   Why:  we will arrange for f/u echocardiogram and appointment.   Contact information:   [place] [address] [phone]      Discharge Medications    Medication List    TAKE these medications        ALPRAZolam 1 MG tablet  Commonly known as:  XANAX  Take 0.5-1 tablets (0.5-1 mg total) by mouth at bedtime as needed.     amLODipine 5 MG tablet  Commonly known as:  NORVASC  Take 1 tablet (5 mg total) by mouth daily.     aspirin 81 MG EC tablet  Take 1 tablet (81 mg total) by mouth  daily.     clonazePAM 1 MG tablet  Commonly known as:  KLONOPIN  Take 1 tablet (1 mg total) by mouth at bedtime.     HYDROcodone-ibuprofen 7.5-200 MG per tablet  Commonly known as:  VICOPROFEN  Take 1 tablet by mouth every 8 (eight) hours as needed.       Outstanding Labs/Studies  Follow-up 2D echocardiogram.  Duration of Discharge Encounter   Greater than 30 minutes including physician time.  Signed, Murray Hodgkins NP 05/08/2015, 3:44 PM

## 2015-05-08 NOTE — Progress Notes (Signed)
Discharge instructions given. Pt verbalized understanding and all questions were answered.  

## 2015-05-08 NOTE — Discharge Instructions (Signed)
***  PLEASE REMEMBER TO BRING ALL OF YOUR MEDICATIONS TO EACH OF YOUR FOLLOW-UP OFFICE VISITS.  

## 2015-05-08 NOTE — Progress Notes (Signed)
  Echocardiogram 2D Echocardiogram has been performed.  Johny Chess 05/08/2015, 4:22 PM

## 2015-05-08 NOTE — Progress Notes (Signed)
Patient Name: Tanner Webb Date of Encounter: 05/08/2015   Principal Problem:   Atypical chest pain Active Problems:   Essential hypertension   Hyperlipidemia    SUBJECTIVE  No recurrent c/p or dyspnea.  For Myoview this AM.  CURRENT MEDS . amLODipine  5 mg Oral Daily  . aspirin EC  81 mg Oral Daily  . atorvastatin  40 mg Oral q1800  . clonazePAM  1 mg Oral QHS  . enoxaparin (LOVENOX) injection  40 mg Subcutaneous Q24H  . regadenoson        OBJECTIVE  Filed Vitals:   05/07/15 1614 05/07/15 2058 05/08/15 0038 05/08/15 0437  BP: 146/86 107/65 134/69 109/74  Pulse: 52 58 59 51  Temp: 97.4 F (36.3 C) 97.9 F (36.6 C) 97.6 F (36.4 C) 97.7 F (36.5 C)  TempSrc: Oral Oral Oral Oral  Resp: 18 18 18 17   Height: 6\' 3"  (1.905 m)     Weight: 216 lb 0.8 oz (98 kg)   216 lb 14.4 oz (98.385 kg)  SpO2: 100% 98% 100% 98%    Intake/Output Summary (Last 24 hours) at 05/08/15 1135 Last data filed at 05/08/15 0843  Gross per 24 hour  Intake    760 ml  Output    575 ml  Net    185 ml   Filed Weights   05/07/15 1614 05/08/15 0437  Weight: 216 lb 0.8 oz (98 kg) 216 lb 14.4 oz (98.385 kg)    PHYSICAL EXAM  General: Pleasant, NAD. Neuro: Alert and oriented X 3. Moves all extremities spontaneously. Psych: Normal affect. HEENT:  Normal  Neck: Supple without bruits or JVD. Lungs:  Resp regular and unlabored, scattered rhonchi. Heart: RRR no s3, s4, or murmurs. Abdomen: Soft, non-tender, non-distended, BS + x 4.  Extremities: No clubbing, cyanosis or edema. DP/PT/Radials 2+ and equal bilaterally.  Accessory Clinical Findings  CBC  Recent Labs  05/07/15 1024 05/07/15 1704  WBC 7.8 9.2  NEUTROABS 4.5  --   HGB 14.1 14.8  HCT 42.4 43.8  MCV 93.2 92.6  PLT 378 323   Basic Metabolic Panel  Recent Labs  05/07/15 1024 05/07/15 1704 05/08/15 0418  NA 142  --  138  K 4.4  --  4.1  CL 108  --  107  CO2 29  --  25  GLUCOSE 110*  --  111*  BUN 10  --  14    CREATININE 0.95 0.87 0.88  CALCIUM 9.2  --  9.1   Liver Function Tests  Recent Labs  05/07/15 1024  AST 21  ALT 20  ALKPHOS 57  BILITOT 0.6  PROT 6.9  ALBUMIN 3.9   Cardiac Enzymes  Recent Labs  05/07/15 1704 05/07/15 2322 05/08/15 0418  TROPONINI <0.03 <0.03 <0.03   BNP Invalid input(s): POCBNP D-Dimer  Recent Labs  05/07/15 1024  DDIMER <0.27   Fasting Lipid Panel  Recent Labs  05/08/15 0418  CHOL 151  HDL 46  LDLCALC 87  TRIG 92  CHOLHDL 3.3   Thyroid Function Tests  Recent Labs  05/07/15 1704  TSH 1.727    TELE  Seen in nuc med.  Radiology/Studies  Dg Chest 2 View  05/07/2015   CLINICAL DATA:  Initial encounter for 1 week history of chest pain  EXAM: CHEST  2 VIEW  COMPARISON:  06/12/2014.  FINDINGS: Lung volumes are low. Cardiopericardial silhouette is at upper limits of normal for size. The lungs are clear without focal pneumonia, edema,  pneumothorax or pleural effusion. Pneumoperitoneum seen of the right hemidiaphragm on the previous study has resolved in the interval. Old right rib fractures noted.  IMPRESSION: Low volume film without acute cardiopulmonary findings.   Electronically Signed   By: Misty Stanley M.D.   On: 05/07/2015 10:51    ASSESSMENT AND PLAN  1.  Atypical Chest Pain:  No recurrence.  Trop neg.  For MV today.  2.  New RBBB:  For echo today.  3.  HL:  LDL 87.  Not on statin at home.  4.  Essential HTN:  Stable on ccb.  5.  Nicotine abuse:  Prior smoker, now vapes.  Cessation advised.  Signed, Murray Hodgkins NP   Attending note Patient seen and discussed with PA Sharolyn Douglas, I agree with his documentation. We will f/u the results of his echo and nuclear stress test today. Likely discharge if no significant findings.   Zandra Abts MD

## 2015-05-12 ENCOUNTER — Telehealth: Payer: Self-pay | Admitting: Cardiology

## 2015-05-12 NOTE — Telephone Encounter (Signed)
Patient states that he was called yesterday with results of Echo. Please return call. Not able to tell who called him. / tg

## 2015-05-21 ENCOUNTER — Ambulatory Visit: Payer: 59 | Admitting: Cardiology

## 2015-06-01 ENCOUNTER — Other Ambulatory Visit: Payer: Self-pay | Admitting: Internal Medicine

## 2015-06-01 NOTE — Telephone Encounter (Signed)
Ok for 1 month refill only because at last 2 visits with me he was asked to set up CPE w/ provider taking new patients(was a patient of Dr Elder Cyphers) as he needs full care for age and HTN and hep c ppost harvoni See recent er after [facility]

## 2015-06-02 NOTE — Telephone Encounter (Signed)
LMOM for pt advising of RF and need for CPE for more.

## 2015-06-11 ENCOUNTER — Ambulatory Visit (INDEPENDENT_AMBULATORY_CARE_PROVIDER_SITE_OTHER): Payer: 59 | Admitting: Emergency Medicine

## 2015-06-11 ENCOUNTER — Other Ambulatory Visit: Payer: Self-pay | Admitting: Internal Medicine

## 2015-06-11 ENCOUNTER — Ambulatory Visit: Payer: 59 | Admitting: Cardiology

## 2015-06-11 VITALS — BP 142/80 | HR 64 | Temp 97.5°F | Resp 20 | Ht 75.0 in | Wt 225.0 lb

## 2015-06-11 DIAGNOSIS — H6122 Impacted cerumen, left ear: Secondary | ICD-10-CM

## 2015-06-11 DIAGNOSIS — J209 Acute bronchitis, unspecified: Secondary | ICD-10-CM | POA: Diagnosis not present

## 2015-06-11 MED ORDER — HYDROCOD POLST-CPM POLST ER 10-8 MG/5ML PO SUER: 5.0000 mL | Freq: Two times a day (BID) | ORAL | Status: DC

## 2015-06-11 MED ORDER — AZITHROMYCIN 250 MG PO TABS: ORAL_TABLET | ORAL | Status: DC

## 2015-06-11 NOTE — Progress Notes (Signed)
Subjective:  Patient ID: Tanner Webb, male    DOB: Sep 12, 1950  Age: 64 y.o. MRN: [id number]  CC: Cough; Sore Throat; and Depression   HPI GAGE WEANT presents  with a concern about a cough. Is a cough productive of purulent sputum. No wheezing or shortness of breath. He has no fever or chills. He has no nasal congestion postnasal drainage and nasal discharge. He denies any nausea or vomiting or stool change.. He has no rash. No abdominal pain. He has no peripheral edema. Granddaughter is ill with similar symptoms  History Conan has a past medical history of Chronic pain in left shoulder; Hyperlipidemia; Hepatitis C infection; Hypertension; Atypical chest pain; and RBBB.   He has past surgical history that includes Cholecystectomy; Splenectomy; Ventral hernia repair (06/10/2014); and laparotomy (N/A, 06/10/2014).   His  family history is not on file.  He   reports that he quit smoking about 13 months ago. He has never used smokeless tobacco. He reports that he does not drink alcohol or use illicit drugs.  Outpatient Prescriptions Prior to Visit  Medication Sig Dispense Refill  . ALPRAZolam (XANAX) 1 MG tablet TAKE 1/2 TO 1 TABLET BY MOUTH AT BEDTIME AS NEEDED 30 tablet 0  . amLODipine (NORVASC) 5 MG tablet Take 1 tablet (5 mg total) by mouth daily. (Patient taking differently: Take 5 mg by mouth at bedtime. ) 90 tablet 1  . aspirin EC 81 MG EC tablet Take 1 tablet (81 mg total) by mouth daily.    . clonazePAM (KLONOPIN) 1 MG tablet Take 1 tablet (1 mg total) by mouth at bedtime. (Patient taking differently: Take 1 mg by mouth at bedtime as needed (sleep). ) 30 tablet 2  . HYDROcodone-ibuprofen (VICOPROFEN) 7.5-200 MG per tablet Take 1 tablet by mouth every 8 (eight) hours as needed. (Patient taking differently: Take 1 tablet by mouth every 8 (eight) hours as needed for moderate pain. ) 90 tablet 0   No facility-administered medications prior to visit.    Social History     Social History  . Marital Status: Single    Spouse Name: N/A  . Number of Children: N/A  . Years of Education: N/A   Social History Main Topics  . Smoking status: Former Smoker    Quit date: 04/30/2014  . Smokeless tobacco: Never Used  . Alcohol Use: No  . Drug Use: No  . Sexual Activity: Yes   Other Topics Concern  . None   Social History Narrative     Review of Systems  Constitutional: Negative for fever, chills and appetite change.  HENT: Positive for congestion and hearing loss. Negative for ear pain, postnasal drip, sinus pressure and sore throat.   Eyes: Negative for pain and redness.  Respiratory: Positive for cough. Negative for shortness of breath and wheezing.   Cardiovascular: Negative for leg swelling.  Gastrointestinal: Negative for nausea, vomiting, abdominal pain, diarrhea, constipation and blood in stool.  Endocrine: Negative for polyuria.  Genitourinary: Negative for dysuria, urgency, frequency and flank pain.  Musculoskeletal: Negative for gait problem.  Skin: Negative for rash.  Neurological: Negative for weakness and headaches.  Psychiatric/Behavioral: Negative for confusion and decreased concentration. The patient is not nervous/anxious.     Objective:  BP 142/80 mmHg  Pulse 64  Temp(Src) 97.5 F (36.4 C) (Oral)  Resp 20  Ht 6\' 3"  (1.905 m)  Wt 225 lb (102.059 kg)  BMI 28.12 kg/m2  SpO2 98%  Physical Exam  Constitutional: He is  oriented to person, place, and time. He appears well-developed and well-nourished. No distress.  HENT:  Head: Normocephalic and atraumatic.  Right Ear: External ear normal.  Left Ear: External ear normal. A foreign body is present. Decreased hearing is noted.  Nose: Nose normal.  Eyes: Conjunctivae and EOM are normal. Pupils are equal, round, and reactive to light. No scleral icterus.  Neck: Normal range of motion. Neck supple. No tracheal deviation present.  Cardiovascular: Normal rate, regular rhythm and normal  heart sounds.   Pulmonary/Chest: Effort normal. No respiratory distress. He has no wheezes. He has no rales.  Abdominal: He exhibits no mass. There is no tenderness. There is no rebound and no guarding.  Musculoskeletal: He exhibits no edema.  Lymphadenopathy:    He has no cervical adenopathy.  Neurological: He is alert and oriented to person, place, and time. Coordination normal.  Skin: Skin is warm and dry. No rash noted.  Psychiatric: He has a normal mood and affect. His behavior is normal.      Assessment & Plan:   Issa was seen today for cough, sore throat and depression.  Diagnoses and all orders for this visit:  Acute bronchitis, unspecified organism  Cerumen impaction, left  Other orders -     azithromycin (ZITHROMAX) 250 MG tablet; Take 2 tabs PO x 1 dose, then 1 tab PO QD x 4 days -     chlorpheniramine-HYDROcodone (TUSSIONEX PENNKINETIC ER) 10-8 MG/5ML SUER; Take 5 mLs by mouth 2 (two) times daily.  I am having Mr. Mazzoni start on azithromycin and chlorpheniramine-HYDROcodone. I am also having him maintain his clonazePAM, HYDROcodone-ibuprofen, amLODipine, aspirin, and ALPRAZolam.  Meds ordered this encounter  Medications  . azithromycin (ZITHROMAX) 250 MG tablet    Sig: Take 2 tabs PO x 1 dose, then 1 tab PO QD x 4 days    Dispense:  6 tablet    Refill:  0  . chlorpheniramine-HYDROcodone (TUSSIONEX PENNKINETIC ER) 10-8 MG/5ML SUER    Sig: Take 5 mLs by mouth 2 (two) times daily.    Dispense:  60 mL    Refill:  0    Appropriate red flag conditions were discussed with the patient as well as actions that should be taken.  Patient expressed his understanding.  Follow-up: Return if symptoms worsen or fail to improve.  Roselee Culver, MD

## 2015-06-11 NOTE — Patient Instructions (Signed)

## 2015-06-16 ENCOUNTER — Telehealth: Payer: Self-pay

## 2015-06-16 NOTE — Telephone Encounter (Signed)
Pt was seen on 10/28 by Dr. Ouida Sills for Acute bronchitis, unspecified organism - Primary. He is still coughing up mucous and is chest is hurting from coughing so much. He wants to know if he can have a refill on his azithromycin (ZITHROMAX) 250 MG tablet [[id number] good.  CB # [phone]

## 2015-06-16 NOTE — Telephone Encounter (Signed)
Advised that if not feeling any better and has finished antibiotic should RTC for further eval. May need inhaler if wheezing or diff med if pneumonia.

## 2015-06-16 NOTE — Telephone Encounter (Signed)
Please advise 

## 2015-06-30 ENCOUNTER — Encounter: Payer: Self-pay | Admitting: Internal Medicine

## 2015-06-30 ENCOUNTER — Ambulatory Visit (INDEPENDENT_AMBULATORY_CARE_PROVIDER_SITE_OTHER): Payer: 59 | Admitting: Internal Medicine

## 2015-06-30 VITALS — BP 152/87 | HR 62 | Temp 97.9°F | Resp 16 | Ht 75.0 in | Wt 226.0 lb

## 2015-06-30 DIAGNOSIS — Z0000 Encounter for general adult medical examination without abnormal findings: Secondary | ICD-10-CM

## 2015-06-30 DIAGNOSIS — B182 Chronic viral hepatitis C: Secondary | ICD-10-CM | POA: Diagnosis not present

## 2015-06-30 DIAGNOSIS — G8929 Other chronic pain: Secondary | ICD-10-CM

## 2015-06-30 DIAGNOSIS — G4700 Insomnia, unspecified: Secondary | ICD-10-CM

## 2015-06-30 DIAGNOSIS — M25512 Pain in left shoulder: Secondary | ICD-10-CM

## 2015-06-30 DIAGNOSIS — Z23 Encounter for immunization: Secondary | ICD-10-CM

## 2015-06-30 MED ORDER — ALPRAZOLAM 1 MG PO TABS: 1.5000 mg | ORAL_TABLET | Freq: Every day | ORAL | Status: DC

## 2015-06-30 MED ORDER — HYDROCODONE-IBUPROFEN 7.5-200 MG PO TABS: 1.0000 | ORAL_TABLET | Freq: Three times a day (TID) | ORAL | Status: DC | PRN

## 2015-06-30 MED ORDER — AMLODIPINE BESYLATE 5 MG PO TABS: 5.0000 mg | ORAL_TABLET | Freq: Every day | ORAL | Status: DC

## 2015-06-30 NOTE — Patient Instructions (Signed)
Shoulder Range of Motion Exercises Shoulder range of motion (ROM) exercises are designed to keep the shoulder moving freely. They are often recommended for people who have shoulder pain. MOVEMENT EXERCISE When you are able, do this exercise 5-6 days per week, or as told by your health care provider. Work toward doing 2 sets of 10 swings. Pendulum Exercise How To Do This Exercise Lying Down 1. Lie face-down on a bed with your abdomen close to the side of the bed. 2. Let your arm hang over the side of the bed. 3. Relax your shoulder, arm, and hand. 4. Slowly and gently swing your arm forward and back. Do not use your neck muscles to swing your arm. They should be relaxed. If you are struggling to swing your arm, have someone gently swing it for you. When you do this exercise for the first time, swing your arm at a 15 degree angle for 15 seconds, or swing your arm 10 times. As pain lessens over time, increase the angle of the swing to 30-45 degrees. 5. Repeat steps 1-4 with the other arm. How To Do This Exercise While Standing 1. Stand next to a sturdy chair or table and hold on to it with your hand.  Bend forward at the waist.  Bend your knees slightly.  Relax your other arm and let it hang limp.  Relax the shoulder blade of the arm that is hanging and let it drop.  While keeping your shoulder relaxed, use body motion to swing your arm in small circles. The first time you do this exercise, swing your arm for about 30 seconds or 10 times. When you do it next time, swing your arm for a little longer.  Stand up tall and relax.  Repeat steps 1-7, this time changing the direction of the circles. 2. Repeat steps 1-8 with the other arm. STRETCHING EXERCISES Do these exercises 3-4 times per day on 5-6 days per week or as told by your health care provider. Work toward holding the stretch for 20 seconds. Stretching Exercise 1 1. Lift your arm straight out in front of you. 2. Bend your arm 90  degrees at the elbow (right angle) so your forearm goes across your body and looks like the letter "L." 3. Use your other arm to gently pull the elbow forward and across your body. 4. Repeat steps 1-3 with the other arm. Stretching Exercise 2 You will need a towel or rope for this exercise. 1. Bend one arm behind your back with the palm facing outward. 2. Hold a towel with your other hand. 3. Reach the arm that holds the towel above your head, and bend that arm at the elbow. Your wrist should be behind your neck. 4. Use your free hand to grab the free end of the towel. 5. With the higher hand, gently pull the towel up behind you. 6. With the lower hand, pull the towel down behind you. 7. Repeat steps 1-6 with the other arm. STRENGTHENING EXERCISES Do each of these exercises at four different times of day (sessions) every day or as told by your health care provider. To begin with, repeat each exercise 5 times (repetitions). Work toward doing 3 sets of 12 repetitions or as told by your health care provider. Strengthening Exercise 1 You will need a light weight for this activity. As you grow stronger, you may use a heavier weight. 1. Standing with a weight in your hand, lift your arm straight out to the side   until it is at the same height as your shoulder. 2. Bend your arm at 90 degrees so that your fingers are pointing to the ceiling. 3. Slowly raise your hand until your arm is straight up in the air. 4. Repeat steps 1-3 with the other arm. Strengthening Exercise 2 You will need a light weight for this activity. As you grow stronger, you may use a heavier weight. 1. Standing with a weight in your hand, gradually move your straight arm in an arc, starting at your side, then out in front of you, then straight up over your head. 2. Gradually move your other arm in an arc, starting at your side, then out in front of you, then straight up over your head. 3. Repeat steps 1-2 with the other  arm. Strengthening Exercise 3 You will need an elastic band for this activity. As you grow stronger, gradually increase the size of the bands or increase the number of bands that you use at one time. 1. While standing, hold an elastic band in one hand and raise that arm up in the air. 2. With your other hand, pull down the band until that hand is by your side. 3. Repeat steps 1-2 with the other arm.   This information is not intended to replace advice given to you by your health care provider. Make sure you discuss any questions you have with your health care provider.   Document Released: 04/29/2003 Document Revised: 12/15/2014 Document Reviewed: 07/27/2014 Elsevier Interactive Patient Education 2016 [employer]  

## 2015-07-02 ENCOUNTER — Ambulatory Visit (INDEPENDENT_AMBULATORY_CARE_PROVIDER_SITE_OTHER): Payer: 59 | Admitting: Cardiology

## 2015-07-02 ENCOUNTER — Encounter: Payer: Self-pay | Admitting: Cardiology

## 2015-07-02 VITALS — BP 149/91 | HR 61 | Ht 75.0 in | Wt 223.8 lb

## 2015-07-02 DIAGNOSIS — I10 Essential (primary) hypertension: Secondary | ICD-10-CM

## 2015-07-02 DIAGNOSIS — R0789 Other chest pain: Secondary | ICD-10-CM

## 2015-07-02 NOTE — Patient Instructions (Signed)
Your physician recommends that you schedule a follow-up appointment AS NEEDED WITH DR. BRANCH  Your physician recommends that you continue on your current medications as directed. Please refer to the Current Medication list given to you today.  Thank you for choosing [facility]!!   

## 2015-07-02 NOTE — Progress Notes (Signed)
Patient ID: ASIEL KAMPFER, male   DOB: 01-Mar-1951, 64 y.o.   MRN: [id number]     Clinical Summary Mr. Dillahunt is a 64 y.o.male seen today for follow up of the following medical problems.  1. Chest pain - admit 04/2015 with chest pain. Ruled out for ACS. Lexiscan MPI was negative for ischemia. Echo LVEF 55-60%, no WMAs  - no recurrent chest pain since hospital discharge. No SOB or DOE   2. HTN - compliant with meds -   3. Bronchitis - being treated by his pcp   Past Medical History  Diagnosis Date  . Chronic pain in left shoulder   . Hyperlipidemia   . Hepatitis C infection   . Hypertension   . Atypical chest pain     a. 04/2015 Lexiscan MV: Ef 56%, no ischemia/infarct.  Marland Kitchen RBBB     a. 04/2015 - first noted.  . Anxiety   . Blood transfusion without reported diagnosis      Allergies  Allergen Reactions  . Penicillins Swelling and Other (See Comments)    "leshions in my mouth"  . Sulfa Antibiotics Swelling     Current Outpatient Prescriptions  Medication Sig Dispense Refill  . ALPRAZolam (XANAX) 1 MG tablet Take 1.5 tablets (1.5 mg total) by mouth at bedtime. 45 tablet 5  . amLODipine (NORVASC) 5 MG tablet Take 1 tablet (5 mg total) by mouth daily. 90 tablet 3  . aspirin EC 81 MG EC tablet Take 1 tablet (81 mg total) by mouth daily.    Marland Kitchen HYDROcodone-ibuprofen (VICOPROFEN) 7.5-200 MG tablet Take 1 tablet by mouth every 8 (eight) hours as needed for moderate pain. 90 tablet 0   No current facility-administered medications for this visit.     Past Surgical History  Procedure Laterality Date  . Cholecystectomy    . Splenectomy    . Ventral hernia repair  06/10/2014  . Laparotomy N/A 06/10/2014    Procedure: EXPLORATORY LAPAROTOMY, ventral hernia repair;  Surgeon: Donnie Mesa, MD;  Location: [facility];  Service: General;  Laterality: N/A;  . Fracture surgery    . Hernia repair       Allergies  Allergen Reactions  . Penicillins Swelling and Other (See Comments)     "leshions in my mouth"  . Sulfa Antibiotics Swelling      Family History  Problem Relation Age of Onset  . Mental illness Mother   . Alzheimer's disease Mother   . Heart disease Father   . Hypertension Father      Social History Mr. Marland reports that he quit smoking about 14 months ago. He has never used smokeless tobacco. Mr. Blystone reports that he does not drink alcohol.   Review of Systems CONSTITUTIONAL: No weight loss, fever, chills, weakness or fatigue.  HEENT: Eyes: No visual loss, blurred vision, double vision or yellow sclerae.No hearing loss, sneezing, congestion, runny nose or sore throat.  SKIN: No rash or itching.  CARDIOVASCULAR: per hpi  RESPIRATORY: No shortness of breath, cough or sputum.  GASTROINTESTINAL: No anorexia, nausea, vomiting or diarrhea. No abdominal pain or blood.  GENITOURINARY: No burning on urination, no polyuria NEUROLOGICAL: No headache, dizziness, syncope, paralysis, ataxia, numbness or tingling in the extremities. No change in bowel or bladder control.  MUSCULOSKELETAL: No muscle, back pain, joint pain or stiffness.  LYMPHATICS: No enlarged nodes. No history of splenectomy.  PSYCHIATRIC: No history of depression or anxiety.  ENDOCRINOLOGIC: No reports of sweating, cold or heat intolerance. No polyuria or polydipsia.  Marland Kitchen  Physical Examination Filed Vitals:   07/02/15 1134  BP: 149/91  Pulse: 61   Filed Vitals:   07/02/15 1134  Height: 6\' 3"  (1.905 m)  Weight: 223 lb 12.8 oz (101.515 kg)    Gen: resting comfortably, no acute distress HEENT: no scleral icterus, pupils equal round and reactive, no palptable cervical adenopathy,  CV: RRR, no m/r/g, no jvd Resp: Clear to auscultation bilaterally GI: abdomen is soft, non-tender, non-distended, normal bowel sounds, no hepatosplenomegaly MSK: extremities are warm, no edema.  Skin: warm, no rash Neuro:  no focal deficits Psych: appropriate affect   Diagnostic Studies 04/2015  echo Study Conclusions  - Left ventricle: The cavity size was normal. Systolic function was normal. The estimated ejection fraction was in the range of 55% to 60%. Wall motion was normal; there were no regional wall motion abnormalities. - Left atrium: The atrium was mildly dilated.     Assessment and Plan  1. Chest pain - recent negative lexiscan MPI - symptoms have resolved - continue risk factor modification  2. HTN - at goal, conitnue current meds   F/u as needed      Arnoldo Lenis, M.D.

## 2015-07-02 NOTE — Progress Notes (Signed)
Subjective:    Patient ID: Tanner Webb, male    DOB: 1951-06-07, 64 y.o.   MRN: [id number]  HPI annual Patient Active Problem List   Diagnosis Date Noted  . Essential hypertension 05/08/2015  . Hyperlipidemia 05/08/2015  . Smoker--former 06/08/2014  . Hepatitis C--resp to harvoni ending few mos ago 10/06/2012  . 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 Anemia 03/30/2012   Doing well in general. He has a family member most responsible for his mother who is in an Alzheimer's living facility. He visits her frequently. This is a cause of anxiety for him. 5 and the day he has a racing mind prevents sleep which is easily taking care off with alprazolam. This is been successful for him without any side effects the next day.  Colonoscopy done earlier this year was normal.  Review of Systems 14pt per form neg x He has urinary frequency day and night and often gets up 2-3 times at night, however he drinks in a normocephalic fluid around the day. He has no prostate symptoms no prior infections. He is followed annually by urology and recent checkup was normal. He also continues to have left shoulder pain at work which he manages with activity exercises and releasing his workload. 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     Objective:   Physical Exam  Constitutional: He is oriented to person, place, and time. He appears well-developed and well-nourished.  HENT:  Head: Normocephalic and atraumatic.  Right Ear: Hearing, tympanic membrane, external ear and ear canal normal.  Left Ear: Hearing, tympanic membrane, external ear and ear canal normal.  Nose: Nose normal.  Mouth/Throat: Uvula is midline, oropharynx is clear and moist and mucous membranes are normal.  Eyes: Conjunctivae, EOM and lids are normal. Pupils are equal, round, and reactive to light. Right eye exhibits no discharge.  Left eye exhibits no discharge. No scleral icterus.  Neck: Trachea normal and normal range of motion. Neck supple. Carotid bruit is not present.  Cardiovascular: Normal rate, regular rhythm, normal heart sounds, intact distal pulses and normal pulses.   No murmur heard. Pulmonary/Chest: Effort normal and breath sounds normal. No respiratory distress. He has no wheezes. He has no rhonchi. He has no rales.  Abdominal: Soft. Normal appearance and bowel sounds are normal. He exhibits no abdominal bruit. There is no tenderness.  Musculoskeletal: Normal range of motion. He exhibits no edema or tenderness.  There is mild crepitus with elevation of the left shoulder above 90 but he has a relatively good range of motion. He does have pain resisted abduction.  Lymphadenopathy:       Head (right side): No submental, no submandibular, no tonsillar, no preauricular, no posterior auricular and no occipital adenopathy present.       Head (left side): No submental, no submandibular, no tonsillar, no preauricular, no posterior auricular and no occipital adenopathy present.    He has no cervical adenopathy.  Neurological: He is alert and oriented to person, place, and time. He has normal strength and normal reflexes. No cranial nerve deficit or sensory deficit. Coordination and gait normal.  Skin: Skin is warm, dry and intact. No lesion and no rash noted.  Psychiatric: He has a normal mood and affect. His speech is normal and behavior is normal. Judgment and thought content normal.          Assessment &  Plan:  Annual exam  Need for immunization against influenza - Plan: Flu Vaccine QUAD 36+ mos IM (Fluarix)  Chronic pain in left shoulder - Plan: HYDROcodone-ibuprofen (VICOPROFEN) 7.5-200 MG tablet--he uses this once or twice a week if he starts to have enough pain at work he can continue. If he does not improve with exercises we will consider orthopedic referral  Chronic hepatitis C without hepatic coma  (Tanner Webb) - has completed harvoni  Insomnia - Plan: Continue when necessary medicines alprazolam  Meds ordered this encounter  Medications  . ALPRAZolam (XANAX) 1 MG tablet    Sig: Take 1.5 tablets (1.5 mg total) by mouth at bedtime.    Dispense:  45 tablet    Refill:  5  . amLODipine (NORVASC) 5 MG tablet    Sig: Take 1 tablet (5 mg total) by mouth daily.    Dispense:  90 tablet    Refill:  3  . HYDROcodone-ibuprofen (VICOPROFEN) 7.5-200 MG tablet    Sig: Take 1 tablet by mouth every 8 (eight) hours as needed for moderate pain.    Dispense:  90 tablet    Refill:  0   Consider Zostavax

## 2015-12-18 ENCOUNTER — Ambulatory Visit (INDEPENDENT_AMBULATORY_CARE_PROVIDER_SITE_OTHER): Payer: BLUE CROSS/BLUE SHIELD | Admitting: Internal Medicine

## 2015-12-18 VITALS — BP 102/60 | HR 60 | Temp 97.8°F | Resp 16 | Ht 75.0 in | Wt 229.0 lb

## 2015-12-18 DIAGNOSIS — Z7189 Other specified counseling: Secondary | ICD-10-CM

## 2015-12-18 DIAGNOSIS — F419 Anxiety disorder, unspecified: Secondary | ICD-10-CM | POA: Diagnosis not present

## 2015-12-18 DIAGNOSIS — G4700 Insomnia, unspecified: Secondary | ICD-10-CM | POA: Diagnosis not present

## 2015-12-18 DIAGNOSIS — B182 Chronic viral hepatitis C: Secondary | ICD-10-CM

## 2015-12-18 DIAGNOSIS — I10 Essential (primary) hypertension: Secondary | ICD-10-CM | POA: Diagnosis not present

## 2015-12-18 DIAGNOSIS — G8929 Other chronic pain: Secondary | ICD-10-CM

## 2015-12-18 DIAGNOSIS — M25512 Pain in left shoulder: Secondary | ICD-10-CM

## 2015-12-18 MED ORDER — AMLODIPINE BESYLATE 5 MG PO TABS: 5.0000 mg | ORAL_TABLET | Freq: Every day | ORAL | Status: DC

## 2015-12-18 MED ORDER — HYDROCODONE-IBUPROFEN 7.5-200 MG PO TABS: 1.0000 | ORAL_TABLET | Freq: Three times a day (TID) | ORAL | Status: DC | PRN

## 2015-12-18 MED ORDER — ALPRAZOLAM 1 MG PO TABS: 1.5000 mg | ORAL_TABLET | Freq: Every day | ORAL | Status: DC

## 2015-12-18 NOTE — Progress Notes (Signed)
Subjective:  By signing my name below, I, Raven Small, attest that this documentation has been prepared under the direction and in the presence of Tami Lin, MD.  Electronically Signed: Thea Alken, ED Scribe. 12/18/2015. 8:22 AM.   Patient ID: Tanner Webb, male    DOB: 1950-12-30, 65 y.o.   MRN: [id number]  HPI Chief Complaint  Patient presents with  . Medication Refill    HPI Comments: Tanner Webb is a 65 y.o. 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 He visits her daily. She is constantly angry with him for not moving her home. As result he is bottle new house and is moving into it planning to bring his mother home. He is very anxious that he will be unable to care for her by himself as he has no dependable family help. He is very uncertain what to do. This is creating great anxiety. This makes him feel guilty and is unsure if he made the right decision. Pt has not been able to sleep at night, only getting about 4-5 hours of sleep. He currently also lives with his wife and stepson.  Pt is needing refills of xanax, norvasc,and vicoprofen. Hypertension is stable without chest pain. Cardiology follow-up has been good. Hepatitis C has been treated  He is once again hurt his shoulder moving into this new home. This is a chronic problem with pain in the left shoulder. He has had off and on orthopedic help.  Patient Active Problem List   Diagnosis Date Noted  . Essential hypertension 05/08/2015  . Hyperlipidemia 05/08/2015  . Atypical chest pain 05/07/2015  . Incarcerated ventral hernia 06/10/2014  . Smoker 06/08/2014  . Hepatitis C 10/06/2012  . Chronic pain in left shoulder 03/30/2012  . Anemia 03/30/2012    Past Surgical History  Procedure Laterality Date  . Cholecystectomy    . Splenectomy    . Ventral hernia repair  06/10/2014  .  Laparotomy N/A 06/10/2014    Procedure: EXPLORATORY LAPAROTOMY, ventral hernia repair;  Surgeon: Donnie Mesa, MD;  Location: [facility];  Service: General;  Laterality: N/A;  . Fracture surgery    . Hernia repair     Allergies  Allergen Reactions  . Penicillins Swelling and Other (See Comments)    "leshions in my mouth"  . Sulfa Antibiotics Swelling   Prior to Admission medications   Medication Sig Start Date End Date Taking? Authorizing Provider  ALPRAZolam Duanne Moron) 1 MG tablet Take 1.5 tablets (1.5 mg total) by mouth at bedtime. 06/30/15  Yes Leandrew Koyanagi, MD  amLODipine (NORVASC) 5 MG tablet Take 1 tablet (5 mg total) by mouth daily. 06/30/15  Yes Leandrew Koyanagi, MD  HYDROcodone-ibuprofen (VICOPROFEN) 7.5-200 MG tablet Take 1 tablet by mouth every 8 (eight) hours as needed for moderate pain. 06/30/15  Yes Leandrew Koyanagi, MD  aspirin EC 81 MG EC tablet Take 1 tablet (81 mg total) by mouth daily. Patient not taking: Reported on 12/18/2015 05/08/15   Rogelia Mire, NP   Social History   Social History  . Marital Status: Single    Spouse Name: N/A  . Number of Children: N/A  . Years of Education: N/A   Occupational History  . Not on file.   Social History Main Topics  . Smoking status: Former Smoker    Quit date: 04/30/2014  .  Smokeless tobacco: Never Used  . Alcohol Use: No  . Drug Use: No  . Sexual Activity: Yes   Other Topics Concern  . Not on file   Social History Narrative  He quit smoking in 2015 There are no outstanding health maintenance issues  Review of Systems  Psychiatric/Behavioral: Positive for sleep disturbance.  No chest pain or palpitations His fatigue is secondary to sleep deprivation from anxiety No unusual weight changes No cough No night sweats No abdominal pain No genitourinary difficulties No headaches or vision difficulties No hearing loss  Objective:   Physical Exam  Constitutional: He is oriented to person, place, and  time. He appears well-developed and well-nourished. No distress.  HENT:  Head: Normocephalic and atraumatic.  Eyes: Conjunctivae and EOM are normal. Pupils are equal, round, and reactive to light.  Neck: Neck supple.  Cardiovascular: Normal rate, regular rhythm, normal heart sounds and intact distal pulses.   Pulmonary/Chest: Effort normal.  Musculoskeletal: Normal range of motion. He exhibits no edema.  The left shoulder has a painful 75-100 arc with pain on resisted abduction  Neurological: He is alert and oriented to person, place, and time. No cranial nerve deficit.  Skin: Skin is warm and dry.  Psychiatric: His behavior is normal.  He is very anxious about the situation with his mother and feels that it is very difficult for him saying no as his guilt is tremendous.  Nursing note and vitals reviewed.     Filed Vitals:   12/18/15 0818  BP: 102/60  Pulse: 60  Temp: 97.8 F (36.6 C)  TempSrc: Oral  Resp: 16  Height: 6\' 3"  (1.905 m)  Weight: 229 lb (103.874 kg)  SpO2: 100%    Assessment & Plan:  I have completed the patient encounter in its entirety as documented by the scribe, with editing by me where necessary. Blenda Wisecup P. Laney Pastor, M.D.  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  Encounter for medication review and counseling for Essential hypertension -No need to change any medications presently -Lab results September 2016 were within normal limits  Meds ordered this encounter  Medications  . ALPRAZolam  (XANAX) 1 MG tablet    Sig: Take 1.5 tablets (1.5 mg total) by mouth at bedtime.    Dispense:  45 tablet    Refill:  5  . amLODipine (NORVASC) 5 MG tablet    Sig: Take 1 tablet (5 mg total) by mouth daily.    Dispense:  90 tablet    Refill:  3  . HYDROcodone-ibuprofen (VICOPROFEN) 7.5-200 MG tablet    Sig: Take 1 tablet by mouth every 8 (eight) hours as needed for moderate pain.    Dispense:  90 tablet    Refill:  0

## 2015-12-18 NOTE — Patient Instructions (Signed)
     IF you received an x-ray today, you will receive an invoice from [facility]. Please contact [facility] at [phone] with questions or concerns regarding your invoice.   IF you received labwork today, you will receive an invoice from [facility]. Please contact [facility] at [phone] with questions or concerns regarding your invoice.   Our billing staff will not be able to assist you with questions regarding bills from these companies.  You will be contacted with the lab results as soon as they are available. The fastest way to get your results is to activate your My Chart account. Instructions are located on the last page of this paperwork. If you have not heard from us regarding the results in 2 weeks, please contact this office.      

## 2015-12-19 DIAGNOSIS — F419 Anxiety disorder, unspecified: Secondary | ICD-10-CM | POA: Insufficient documentation

## 2016-09-01 ENCOUNTER — Ambulatory Visit (INDEPENDENT_AMBULATORY_CARE_PROVIDER_SITE_OTHER): Payer: BLUE CROSS/BLUE SHIELD

## 2016-09-02 ENCOUNTER — Ambulatory Visit (INDEPENDENT_AMBULATORY_CARE_PROVIDER_SITE_OTHER): Payer: BLUE CROSS/BLUE SHIELD | Admitting: Family Medicine

## 2016-09-02 ENCOUNTER — Ambulatory Visit (INDEPENDENT_AMBULATORY_CARE_PROVIDER_SITE_OTHER): Payer: BLUE CROSS/BLUE SHIELD

## 2016-09-02 VITALS — BP 130/80 | HR 84 | Temp 98.1°F | Resp 17 | Ht 75.0 in | Wt 235.0 lb

## 2016-09-02 DIAGNOSIS — R0789 Other chest pain: Secondary | ICD-10-CM

## 2016-09-02 DIAGNOSIS — W19XXXA Unspecified fall, initial encounter: Secondary | ICD-10-CM

## 2016-09-02 DIAGNOSIS — M546 Pain in thoracic spine: Secondary | ICD-10-CM

## 2016-09-02 DIAGNOSIS — Y92099 Unspecified place in other non-institutional residence as the place of occurrence of the external cause: Secondary | ICD-10-CM | POA: Diagnosis not present

## 2016-09-02 DIAGNOSIS — Y92009 Unspecified place in unspecified non-institutional (private) residence as the place of occurrence of the external cause: Secondary | ICD-10-CM

## 2016-09-02 MED ORDER — HYDROCODONE-IBUPROFEN 7.5-200 MG PO TABS: 1.0000 | ORAL_TABLET | ORAL | 0 refills | Status: DC | PRN

## 2016-09-02 NOTE — Patient Instructions (Addendum)
     IF you received an x-ray today, you will receive an invoice from [facility]. Please contact [facility] at [phone] with questions or concerns regarding your invoice.   IF you received labwork today, you will receive an invoice from [facility]. Please contact [facility] at [phone] with questions or concerns regarding your invoice.   Our billing staff will not be able to assist you with questions regarding bills from these companies.  You will be contacted with the lab results as soon as they are available. The fastest way to get your results is to activate your My Chart account. Instructions are located on the last page of this paperwork. If you have not heard from us regarding the results in 2 weeks, please contact this office.     Chest Wall Pain Chest wall pain is pain in or around the bones and muscles of your chest. Sometimes, an injury causes this pain. Sometimes, the cause may not be known. This pain may take several weeks or longer to get better. Follow these instructions at home: Pay attention to any changes in your symptoms. Take these actions to help with your pain:  Rest as told by your health care provider.  Avoid activities that cause pain. These include any activities that use your chest muscles or your abdominal and side muscles to lift heavy items.  If directed, apply ice to the painful area: ? Put ice in a plastic bag. ? Place a towel between your skin and the bag. ? Leave the ice on for 20 minutes, 2-3 times per day.  Take over-the-counter and prescription medicines only as told by your health care provider.  Do not use tobacco products, including cigarettes, chewing tobacco, and e-cigarettes. If you need help quitting, ask your health care provider.  Keep all follow-up visits as told by your health care provider. This is important.  Contact a health care provider if:  You have a fever.  Your chest pain becomes worse.  You have  new symptoms. Get help right away if:  You have nausea or vomiting.  You feel sweaty or light-headed.  You have a cough with phlegm (sputum) or you cough up blood.  You develop shortness of breath. This information is not intended to replace advice given to you by your health care provider. Make sure you discuss any questions you have with your health care provider. Document Released: 07/31/2005 Document Revised: 12/09/2015 Document Reviewed: 10/26/2014 Elsevier Interactive Patient Education  2017 Elsevier Inc.  

## 2016-09-02 NOTE — Progress Notes (Signed)
Subjective:  By signing my name below, I, Tanner Webb, attest that this documentation has been prepared under the direction and in the presence of Tanner Cheadle, MD. Electronically Signed: Moises Webb, [place]. 09/02/2016 , 10:50 AM .  Patient was seen in Room 14 .   Patient ID: Tanner Webb, male    DOB: 1951/04/10, 66 y.o.   MRN: [id number] Chief Complaint  Patient presents with  . Back Pain   HPI Tanner Webb is a 66 y.o. male who presents to Primary Care at [facility] complaining of back pain due to fall that occurred 2 days ago. Patient states he was walking his dog 2 days ago and walking on a hill that had some snow. He was wearing shoes with poor grip and he slipped, falling flat on his back. He fell hard, knocking the wind out of him, and he laid in the snow for about 3~4 minutes. He reports feeling sore in his neck, and under his shoulder blades. He also notes some lower left chest pain, and hurts when he takes a deep breath. When he sits down, he describes the pain "feeling like someone took a baseball bat and struck his back". He's been taking ibuprofen 1 tablet every 4~6 hours without relief. He denies having much sleep due to the significant pain. He denies urinary or bowel symptoms. He denies pain radiating down his legs.   Past Medical History:  Diagnosis Date  . Anxiety   . Atypical chest pain    a. 04/2015 Lexiscan MV: Ef 56%, no ischemia/infarct.  . Webb transfusion without reported diagnosis   . Chronic pain in left shoulder   . Hepatitis C infection   . Hyperlipidemia   . Hypertension   . RBBB    a. 04/2015 - first noted.   Prior to Admission medications   Medication Sig Start Date End Date Taking? Authorizing Provider  amLODipine (NORVASC) 5 MG tablet Take 1 tablet (5 mg total) by mouth daily. 12/18/15  Yes Leandrew Koyanagi, MD  aspirin EC 81 MG EC tablet Take 1 tablet (81 mg total) by mouth daily. 05/08/15  Yes Rogelia Mire, NP  ALPRAZolam Duanne Moron) 1 MG  tablet Take 1.5 tablets (1.5 mg total) by mouth at bedtime. Patient not taking: Reported on 09/02/2016 12/18/15   Leandrew Koyanagi, MD  HYDROcodone-ibuprofen (VICOPROFEN) 7.5-200 MG tablet Take 1 tablet by mouth every 8 (eight) hours as needed for moderate pain. Patient not taking: Reported on 09/02/2016 12/18/15   Leandrew Koyanagi, MD   Allergies  Allergen Reactions  . Penicillins Swelling and Other (See Comments)    "leshions in my mouth"  . Sulfa Antibiotics Swelling   Review of Systems  Constitutional: Negative for fatigue and unexpected weight change.  Eyes: Negative for visual disturbance.  Respiratory: Negative for cough, chest tightness and shortness of breath.   Cardiovascular: Negative for chest pain, palpitations and leg swelling.  Gastrointestinal: Negative for abdominal pain and Webb in stool.  Genitourinary: Negative for hematuria.  Musculoskeletal: Positive for back pain.  Neurological: Negative for dizziness, light-headedness and headaches.       Objective:   Physical Exam  Constitutional: He is oriented to person, place, and time. He appears well-developed and well-nourished. No distress.  HENT:  Head: Normocephalic and atraumatic.  Eyes: EOM are normal. Pupils are equal, round, and reactive to light.  Neck: Neck supple.  Cardiovascular: Normal rate, regular rhythm and normal heart sounds.   Pulmonary/Chest: Effort normal. No respiratory  distress. He has decreased breath sounds (throughout).  Musculoskeletal: Normal range of motion.  Back: no point tenderness over the thoracic or lumbar spinous process; severe point tenderness over the left posterior rib along the medial and distal margin of left scapula, hyperaesthetic; he has some edema and swelling over the tender area  Neurological: He is alert and oriented to person, place, and time.  Skin: Skin is warm and dry.  Psychiatric: He has a normal mood and affect. His behavior is normal.  Nursing note and vitals  reviewed.   BP 130/80 (BP Location: Right Arm, Patient Position: Sitting, Cuff Size: Normal)   Pulse 84   Temp 98.1 F (36.7 C) (Oral)   Resp 17   Ht 6\' 3"  (1.905 m)   Wt 235 lb (106.6 kg)   SpO2 94%   BMI 29.37 kg/m    Dg Ribs Unilateral W/chest Left  Result Date: 09/02/2016 CLINICAL DATA:  Pain after fall. EXAM: LEFT RIBS AND CHEST - 3+ VIEW COMPARISON:  Chest x-ray May 07, 2015 FINDINGS: Mild scarring or atelectasis in the bases. The cardiomediastinal silhouette is normal. No pneumothorax. No rib fractures identified. IMPRESSION: Negative. Electronically Signed   By: Dorise Bullion III M.D   On: 09/02/2016 11:15       Assessment & Plan:   1. Acute left-sided thoracic back pain   2. Left-sided chest wall pain   3. Fall at home, initial encounter     Orders Placed This Encounter  Procedures  . DG Ribs Unilateral W/Chest Left    Standing Status:   Future    Number of Occurrences:   1    Standing Expiration Date:   09/02/2017    Order Specific Question:   Reason for Exam (SYMPTOM  OR DIAGNOSIS REQUIRED)    Answer:   fall with severe tenderness over medial and lower aspect of left scapula; and left lower chest.    Order Specific Question:   Preferred imaging location?    Answer:   External    Meds ordered this encounter  Medications  . HYDROcodone-ibuprofen (VICOPROFEN) 7.5-200 MG tablet    Sig: Take 1 tablet by mouth every 4 (four) hours as needed for moderate pain.    Dispense:  42 tablet    Refill:  0    I personally performed the services described in this documentation, which was scribed in my presence. The recorded information has been reviewed and considered, and addended by me as needed.   Tanner Webb, M.D.  [facility] [address] [employer] [place], [place] [address] [phone] phone [phone] fax  09/12/16 10:12 PM

## 2016-09-14 ENCOUNTER — Ambulatory Visit: Payer: BLUE CROSS/BLUE SHIELD | Admitting: Family Medicine

## 2016-09-16 ENCOUNTER — Ambulatory Visit (INDEPENDENT_AMBULATORY_CARE_PROVIDER_SITE_OTHER): Payer: BLUE CROSS/BLUE SHIELD | Admitting: Family Medicine

## 2016-09-16 VITALS — BP 110/72 | HR 65 | Temp 98.3°F | Resp 16 | Ht 75.0 in | Wt 226.0 lb

## 2016-09-16 DIAGNOSIS — G4700 Insomnia, unspecified: Secondary | ICD-10-CM

## 2016-09-16 DIAGNOSIS — Y92099 Unspecified place in other non-institutional residence as the place of occurrence of the external cause: Secondary | ICD-10-CM | POA: Diagnosis not present

## 2016-09-16 DIAGNOSIS — W19XXXA Unspecified fall, initial encounter: Secondary | ICD-10-CM | POA: Diagnosis not present

## 2016-09-16 DIAGNOSIS — Y92009 Unspecified place in unspecified non-institutional (private) residence as the place of occurrence of the external cause: Secondary | ICD-10-CM

## 2016-09-16 DIAGNOSIS — S20212D Contusion of left front wall of thorax, subsequent encounter: Secondary | ICD-10-CM

## 2016-09-16 MED ORDER — TRAZODONE HCL 50 MG PO TABS: 50.0000 mg | ORAL_TABLET | Freq: Every day | ORAL | 3 refills | Status: DC

## 2016-09-16 MED ORDER — HYDROCODONE-IBUPROFEN 7.5-200 MG PO TABS: 1.0000 | ORAL_TABLET | ORAL | 0 refills | Status: DC | PRN

## 2016-09-16 NOTE — Progress Notes (Signed)
By signing my name below, I, Mesha Guinyard, attest that this documentation has been prepared under the direction and in the presence of Delman Cheadle, MD.  Electronically Signed: Verlee Monte, Medical Scribe. 09/16/16. 9:38 AM.  Subjective:    Patient ID: Tanner Webb, male    DOB: 1950-10-20, 66 y.o.   MRN: [id number]  HPI Chief Complaint  Patient presents with  . Medication Refill    hydrocodone    HPI Comments: Tanner Webb is a 66 y.o. male who presents to the [facility] complaining of chest pain and medication refill. Pt was seen 2 weeks prior after falling on ice causing left-side thoracic back pain, and left side chest wall pain. Pt states his chest still hurts and he's having trouble taking a deep breath and has to use a pillow to get comfortable while laying down. Pt isn't sure if he has bruises but reports pain in his back. Takes hydrocodone TID with little relief to his sxs. The other over the counter medication he is taking is baby ASA. Pt has been off of xanax for 1-2 months after taking 1.5 xanax for sleep for years. Pt understands the risk of combining hydrocodone and xanax so he decided to discontinue his regimen. Pt has been treated for Hep C, but he stopped taking tylenol due to risk of liver dmaage. Denies fevers, chills, rhinorrhea, SOB, and wheezing.  Patient Active Problem List   Diagnosis Date Noted  . Anxiety 12/19/2015  . Essential hypertension 05/08/2015  . Hyperlipidemia 05/08/2015  . Atypical chest pain 05/07/2015  . Incarcerated ventral hernia 06/10/2014  . Smoker 06/08/2014  . Hepatitis C 10/06/2012  . Chronic pain in left shoulder 03/30/2012  . Anemia 03/30/2012   Past Medical History:  Diagnosis Date  . Anxiety   . Atypical chest pain    a. 04/2015 Lexiscan MV: Ef 56%, no ischemia/infarct.  . Blood transfusion without reported diagnosis   . Chronic pain in left shoulder   . Hepatitis C infection   . Hyperlipidemia   .  Hypertension   . RBBB    a. 04/2015 - first noted.   Past Surgical History:  Procedure Laterality Date  . CHOLECYSTECTOMY    . FRACTURE SURGERY    . HERNIA REPAIR    . LAPAROTOMY N/A 06/10/2014   Procedure: EXPLORATORY LAPAROTOMY, ventral hernia repair;  Surgeon: Donnie Mesa, MD;  Location: [facility];  Service: General;  Laterality: N/A;  . SPLENECTOMY    . VENTRAL HERNIA REPAIR  06/10/2014   Allergies  Allergen Reactions  . Penicillins Swelling and Other (See Comments)    "leshions in my mouth"  . Sulfa Antibiotics Swelling   Prior to Admission medications   Medication Sig Start Date End Date Taking? Authorizing Provider  ALPRAZolam Duanne Moron) 1 MG tablet Take 1.5 tablets (1.5 mg total) by mouth at bedtime. Patient not taking: Reported on 09/02/2016 12/18/15   Leandrew Koyanagi, MD  amLODipine (NORVASC) 5 MG tablet Take 1 tablet (5 mg total) by mouth daily. 12/18/15   Leandrew Koyanagi, MD  aspirin EC 81 MG EC tablet Take 1 tablet (81 mg total) by mouth daily. 05/08/15   Rogelia Mire, NP  HYDROcodone-ibuprofen (VICOPROFEN) 7.5-200 MG tablet Take 1 tablet by mouth every 4 (four) hours as needed for moderate pain. 09/02/16   Shawnee Knapp, MD   Social History   Social History  . Marital status: Single    Spouse name: N/A  .  Number of children: N/A  . Years of education: N/A   Occupational History  . Not on file.   Social History Main Topics  . Smoking status: Former Smoker    Quit date: 04/30/2014  . Smokeless tobacco: Never Used  . Alcohol use No  . Drug use: No  . Sexual activity: Yes   Other Topics Concern  . Not on file   Social History Narrative  . No narrative on file   Review of Systems  Constitutional: Negative for chills and fever.  HENT: Negative for rhinorrhea.   Respiratory: Negative for shortness of breath and wheezing.   Cardiovascular: Positive for chest pain (chest wall).  Musculoskeletal: Positive for back pain.   Objective:  Physical Exam    Constitutional: He appears well-developed and well-nourished. No distress.  HENT:  Head: Normocephalic and atraumatic.  Eyes: Conjunctivae are normal.  Neck: Neck supple.  Cardiovascular: Normal rate, regular rhythm and normal heart sounds.  Exam reveals no friction rub.   No murmur heard. Pulmonary/Chest: Effort normal and breath sounds normal. No respiratory distress. He has no wheezes. He has no rales.  Left flank lower rib area with some point tenderness around rib 10 and 11 but no crepitus or malformation Around the left anterior lateral lower chest wall with some edema and spasm and point tenderness  Neurological: He is alert.  Skin: Skin is warm and dry.  Psychiatric: He has a normal mood and affect. His behavior is normal.  Nursing note and vitals reviewed.  BP 110/72   Pulse 65   Temp 98.3 F (36.8 C)   Resp 16   Ht 6\' 3"  (1.905 m)   Wt 226 lb (102.5 kg)   SpO2 97%   BMI 28.25 kg/m  Assessment & Plan:   1. Rib contusion, left, subsequent encounter   2. Fall at home, initial encounter   3. Insomnia, unspecified type     Meds ordered this encounter  Medications  . traZODone (DESYREL) 50 MG tablet    Sig: Take 1 tablet (50 mg total) by mouth at bedtime.    Dispense:  30 tablet    Refill:  3  . HYDROcodone-ibuprofen (VICOPROFEN) 7.5-200 MG tablet    Sig: Take 1 tablet by mouth every 4 (four) hours as needed for moderate pain.    Dispense:  42 tablet    Refill:  0    I personally performed the services described in this documentation, which was scribed in my presence. The recorded information has been reviewed and considered, and addended by me as needed.   Delman Cheadle, M.D.  Primary Care at [facility] [address] [place], [place] [address] [phone] phone [phone] fax  10/03/16 11:24 PM

## 2016-09-16 NOTE — Patient Instructions (Addendum)
Start taking an extra-strength acetaminophen (tylenol) with each vicoprofen. Use the trazodone 1-2 hours before bed.    IF you received an x-ray today, you will receive an invoice from [facility]. Please contact [facility] at [phone] with questions or concerns regarding your invoice.   IF you received labwork today, you will receive an invoice from [facility]. Please contact [facility] at [phone] with questions or concerns regarding your invoice.   Our billing staff will not be able to assist you with questions regarding bills from these companies.  You will be contacted with the lab results as soon as they are available. The fastest way to get your results is to activate your My Chart account. Instructions are located on the last page of this paperwork. If you have not heard from [place] regarding the results in 2 weeks, please contact this office.      Chest Contusion, Adult A chest contusion is a deep bruise to the chest. Contusions are usually the result of a blunt injury to tissues under the skin. The injury can damage the small blood vessels under the skin, which causes bleeding under the skin. The skin overlying the contusion may turn blue, purple, or yellow. Minor injuries may give you a painless contusion, but more severe contusions may stay painful and swollen for a few weeks. What are the causes? A contusion is usually caused by a hard hit (blow), trauma, or direct force to your chest, such as:  A motor vehicle accident.  Falls.  Bicycle injuries.  Contact sport injuries. What increases the risk? You may be at a higher risk for a chest contusion if you play a sport in which falls and contact are common, such as football or soccer. What are the signs or symptoms? Symptoms of this condition include:  Chest swelling.  Pain and tenderness of the chest.  Discomfort with certain movements of the upper torso.  Discoloration of the chest. The area may  have redness and then turn blue, purple, or yellow.  Discomfort when taking deep breaths. How is this diagnosed? A chest contusion is diagnosed from a physical exam and your medical history. An X-ray may be needed to determine if there were any associated injuries, such as broken bones (fractures). Sometimes other tests such as CT scans, ultrasounds, or MRIs may be needed if internal injuries are suspected. A test that shows the amount of oxygen in your blood (pulse oximetry) may be done if you have trouble breathing. How is this treated? Often, the best treatment for a chest contusion is resting and applying ice to the injured area. Deep-breathing exercises may be recommended to reduce the risk of pneumonia. Oxygen therapy may be given if you have trouble breathing or have low oxygen levels. Over-the-counter medicines may also be recommended for pain control. Follow these instructions at home:  If directed, apply ice to the injured area.  Put ice in a plastic bag.  Place a towel between your skin and the bag.  Leave the ice on for 20 minutes, 2-3 times per day.  Take over-the-counter and prescription medicines only as told by your health care provider.  Do any deep-breathing exercises as told by your health care provider, if this applies.  Do not lie down flat on your back. Keep your head and chest raised (elevated) when you are resting or sleeping.  Do not use any products that contain nicotine or tobacco, such as cigarettes and e-cigarettes. If you need help quitting, ask your health care  provider.  Do not lift anything that causes you discomfort or pain. Contact a health care provider if:  Your swelling or pain is not relieved with medicines or treatment.  You have increased bruising or swelling.  You have pain that is getting worse.  Your symptoms have not improved after one week. Get help right away if:  You have a sudden, significant increase in pain.  You have  difficulty breathing.  You have dizziness, weakness, or fainting.  You have blood in your urine or stool.  You cough up blood or you vomit blood. Summary  A chest contusion is a deep bruise to the chest that is usually caused by a hard hit, trauma, or direct force to your chest.  Treatment for a chest contusion may include resting and applying ice to the injured area.  Contact a health care provider if you have problems breathing or if your pain does not improve with treatment. This information is not intended to replace advice given to you by your health care provider. Make sure you discuss any questions you have with your health care provider. Document Released: 04/25/2001 Document Revised: 04/27/2016 Document Reviewed: 04/27/2016 Elsevier Interactive Patient Education  2017 [employer].

## 2016-10-25 NOTE — Progress Notes (Signed)
Subjective:    Patient ID: Tanner Webb, male    DOB: [birth year], 66 y.o.   MRN: [id number] Chief Complaint  Patient presents with  . Medication Refill    xanax    HPI  Tanner Webb is a delightful 66 yo male here today for a medication refill.  HTN: On amlodipine 5. Was controlled at all visits this year. On asa 81mg  qd.  Rib pain from fall: 08/31/2016 when walking dog in the snow: seen 1/20 and 2/3 treated with ice, rest, and prn vicoprofen. Rib xray 1/20 nml. Avoids apap due to h/o hep c though treated. He states he still occasional pain. No ShoB or pleuritic pain but still some occ left flank  Insomnia: Was alprazolam 1.5mg  qhs for many years but decided to try trazodone instead while on opiates for pain control.  Tramadol did not work and it made him feel weird.  Depression screen [facility] 2/9 10/26/2016 09/16/2016 09/02/2016 12/18/2015 06/30/2015  Decreased Interest 0 0 0 0 2  Down, Depressed, Hopeless 0 0 0 0 1  PHQ - 2 Score 0 0 0 0 3  Altered sleeping - - - - 0  Tired, decreased energy - - - - 3  Change in appetite - - - - 2  Feeling bad or failure about yourself  - - - - 3  Trouble concentrating - - - - 0  Moving slowly or fidgety/restless - - - - 2  Suicidal thoughts - - - - 0  PHQ-9 Score - - - - 13   Past Medical History:  Diagnosis Date  . Anxiety   . Atypical chest pain    a. 04/2015 Lexiscan MV: Ef 56%, no ischemia/infarct.  . Blood transfusion without reported diagnosis   . Chronic pain in left shoulder   . Hepatitis C infection, cured    resolved after treatment wiht Harvoni  . Hyperlipidemia   . Hypertension   . RBBB    a. 04/2015 - first noted.   Past Surgical History:  Procedure Laterality Date  . CHOLECYSTECTOMY    . FRACTURE SURGERY    . HERNIA REPAIR    . LAPAROTOMY N/A 06/10/2014   Procedure: EXPLORATORY LAPAROTOMY, ventral hernia repair;  Surgeon: Donnie Mesa, MD;  Location: [facility];  Service: General;  Laterality: N/A;  . SPLENECTOMY    . VENTRAL  HERNIA REPAIR  06/10/2014   Current Outpatient Prescriptions on File Prior to Visit  Medication Sig Dispense Refill  . amLODipine (NORVASC) 5 MG tablet Take 1 tablet (5 mg total) by mouth daily. 90 tablet 3  . aspirin EC 81 MG EC tablet Take 1 tablet (81 mg total) by mouth daily.     No current facility-administered medications on file prior to visit.    Allergies  Allergen Reactions  . Penicillins Swelling and Other (See Comments)    "leshions in my mouth"  . Sulfa Antibiotics Swelling   Family History  Problem Relation Age of Onset  . Mental illness Mother   . Alzheimer's disease Mother   . Heart disease Father   . Hypertension Father    Social History   Social History  . Marital status: Single    Spouse name: N/A  . Number of children: N/A  . Years of education: N/A   Social History Main Topics  . Smoking status: Former Smoker    Quit date: 04/30/2014  . Smokeless tobacco: Never Used  . Alcohol use No  . Drug use: No  .  Sexual activity: Yes   Other Topics Concern  . None   Social History Narrative  . None     Review of Systems See hpi    Objective:   Physical Exam  Constitutional: He is oriented to person, place, and time. He appears well-developed and well-nourished. No distress.  HENT:  Head: Normocephalic and atraumatic.  Eyes: Conjunctivae are normal. Pupils are equal, round, and reactive to light. No scleral icterus.  Neck: Normal range of motion. Neck supple. No thyromegaly present.  Cardiovascular: Normal rate, regular rhythm, normal heart sounds and intact distal pulses.   Pulmonary/Chest: Effort normal and breath sounds normal. No respiratory distress.  Musculoskeletal: He exhibits no edema.  Lymphadenopathy:    He has no cervical adenopathy.  Neurological: He is alert and oriented to person, place, and time.  Skin: Skin is warm and dry. He is not diaphoretic.  Psychiatric: He has a normal mood and affect. His behavior is normal.      BP  111/74   Temp 97.6 F (36.4 C) (Oral)   Resp 16   Ht 6\' 3"  (1.905 m)   Wt 233 lb (105.7 kg)   SpO2 96%   BMI 29.12 kg/m      Assessment & Plan:   1. Essential hypertension   2. Insomnia, unspecified type    f/u in 6 mos for refills at [facility]  Meds ordered this encounter  Medications  . ALPRAZolam (XANAX) 1 MG tablet    Sig: Take 1.5 tablets (1.5 mg total) by mouth at bedtime.    Dispense:  45 tablet    Refill:  5      Delman Cheadle, M.D.  Primary Care at [facility] [address] [place], [place] [address] [phone] phone [phone] fax  12/10/16 7:43 PM

## 2016-10-26 ENCOUNTER — Ambulatory Visit (INDEPENDENT_AMBULATORY_CARE_PROVIDER_SITE_OTHER): Payer: BLUE CROSS/BLUE SHIELD | Admitting: Family Medicine

## 2016-10-26 ENCOUNTER — Encounter: Payer: Self-pay | Admitting: Family Medicine

## 2016-10-26 VITALS — BP 111/74 | Temp 97.6°F | Resp 16 | Ht 75.0 in | Wt 233.0 lb

## 2016-10-26 DIAGNOSIS — I10 Essential (primary) hypertension: Secondary | ICD-10-CM

## 2016-10-26 DIAGNOSIS — G4700 Insomnia, unspecified: Secondary | ICD-10-CM | POA: Diagnosis not present

## 2016-10-26 MED ORDER — ALPRAZOLAM 1 MG PO TABS: 1.5000 mg | ORAL_TABLET | Freq: Every day | ORAL | 5 refills | Status: DC

## 2016-10-26 NOTE — Patient Instructions (Signed)
     IF you received an x-ray today, you will receive an invoice from [facility]. Please contact [facility] at [phone] with questions or concerns regarding your invoice.   IF you received labwork today, you will receive an invoice from [facility]. Please contact [facility] at [phone] with questions or concerns regarding your invoice.   Our billing staff will not be able to assist you with questions regarding bills from these companies.  You will be contacted with the lab results as soon as they are available. The fastest way to get your results is to activate your My Chart account. Instructions are located on the last page of this paperwork. If you have not heard from us regarding the results in 2 weeks, please contact this office.     

## 2017-02-19 ENCOUNTER — Ambulatory Visit (HOSPITAL_COMMUNITY): Payer: Self-pay | Admitting: Psychiatry

## 2017-02-19 NOTE — Progress Notes (Deleted)
[facility] MD/PA/NP OP Progress Note  02/19/2017 1:10 PM Tanner Webb  MRN:  [id number]  Chief Complaint:  Subjective:  *** HPI:  Tanner Webb is a 66 year old male with depression, hypertension, who is referred for   On xanax,   Visit Diagnosis: No diagnosis found.  Past Psychiatric History:  Outpatient:  Psychiatry admission:  Previous suicide attempt:  Past trials of medication:  History of violence:   Past Medical History:  Past Medical History:  Diagnosis Date  . Anxiety   . Atypical chest pain    a. 04/2015 Lexiscan MV: Ef 56%, no ischemia/infarct.  . Blood transfusion without reported diagnosis   . Chronic pain in left shoulder   . Hepatitis C infection, cured    resolved after treatment wiht Harvoni  . Hyperlipidemia   . Hypertension   . RBBB    a. 04/2015 - first noted.    Past Surgical History:  Procedure Laterality Date  . CHOLECYSTECTOMY    . FRACTURE SURGERY    . HERNIA REPAIR    . LAPAROTOMY N/A 06/10/2014   Procedure: EXPLORATORY LAPAROTOMY, ventral hernia repair;  Surgeon: Donnie Mesa, MD;  Location: [facility];  Service: General;  Laterality: N/A;  . SPLENECTOMY    . VENTRAL HERNIA REPAIR  06/10/2014    Family Psychiatric History: ***  Family History:  Family History  Problem Relation Age of Onset  . Mental illness Mother   . Alzheimer's disease Mother   . Heart disease Father   . Hypertension Father     Social History:  Social History   Social History  . Marital status: Single    Spouse name: N/A  . Number of children: N/A  . Years of education: N/A   Social History Main Topics  . Smoking status: Former Smoker    Quit date: 04/30/2014  . Smokeless tobacco: Never Used  . Alcohol use No  . Drug use: No  . Sexual activity: Yes   Other Topics Concern  . Not on file   Social History Narrative  . No narrative on file    Allergies:  Allergies  Allergen Reactions  . Penicillins Swelling and Other (See Comments)    "leshions in  my mouth"  . Sulfa Antibiotics Swelling    Metabolic Disorder Labs: No results found for: HGBA1C, MPG No results found for: PROLACTIN Lab Results  Component Value Date   CHOL 151 05/08/2015   TRIG 92 05/08/2015   HDL 46 05/08/2015   CHOLHDL 3.3 05/08/2015   VLDL 18 05/08/2015   LDLCALC 87 05/08/2015   LDLCALC 81 12/29/2013     Current Medications: Current Outpatient Prescriptions  Medication Sig Dispense Refill  . ALPRAZolam (XANAX) 1 MG tablet Take 1.5 tablets (1.5 mg total) by mouth at bedtime. 45 tablet 5  . amLODipine (NORVASC) 5 MG tablet Take 1 tablet (5 mg total) by mouth daily. 90 tablet 3  . aspirin EC 81 MG EC tablet Take 1 tablet (81 mg total) by mouth daily.     No current facility-administered medications for this visit.     Neurologic: Headache: No Seizure: No Paresthesias: No  Musculoskeletal: Strength & Muscle Tone: within normal limits Gait & Station: normal Patient leans: N/A  Psychiatric Specialty Exam: ROS  There were no vitals taken for this visit.There is no height or weight on file to calculate BMI.  General Appearance: Fairly Groomed  Eye Contact:  Good  Speech:  Clear and Coherent  Volume:  Normal  Mood:  {BHH MOOD:22306}  Affect:  {Affect (PAA):22687}  Thought Process:  Coherent and Goal Directed  Orientation:  Full (Time, Place, and Person)  Thought Content: Logical   Suicidal Thoughts:  {ST/HT (PAA):22692}  Homicidal Thoughts:  {ST/HT (PAA):22692}  Memory:  Immediate;   Good Recent;   Good Remote;   Good  Judgement:  {Judgement (PAA):22694}  Insight:  {Insight (PAA):22695}  Psychomotor Activity:  Normal  Concentration:  Concentration: Good and Attention Span: Good  Recall:  Good  Fund of Knowledge: Good  Language: Good  Akathisia:  No  Handed:  Right  AIMS (if indicated):  N/A  Assets:  Communication Skills Desire for Improvement  ADL's:  Intact  Cognition: WNL  Sleep:  ***   Assessment  Plan  The patient  demonstrates the following risk factors for suicide: Chronic risk factors for suicide include: {Chronic Risk Factors for QJJHERD:40814481}. Acute risk factors for suicide include: {Acute Risk Factors for EHUDJSH:70263785}. Protective factors for this patient include: {Protective Factors for Suicide YIFO:27741287}. Considering these factors, the overall suicide risk at this point appears to be {Desc; low/moderate/high:110033}. Patient {ACTION; IS/IS OMV:67209470} appropriate for outpatient follow up.   Treatment Plan Summary:Plan a above   Norman Clay, MD 02/19/2017, 1:10 PM

## 2017-02-22 ENCOUNTER — Ambulatory Visit (HOSPITAL_COMMUNITY): Payer: Self-pay | Admitting: Psychiatry

## 2017-02-26 ENCOUNTER — Telehealth (HOSPITAL_COMMUNITY): Payer: Self-pay | Admitting: *Deleted

## 2017-02-26 NOTE — Telephone Encounter (Signed)
Office received new pt ref for pt. Office scheduled appt for pt on 02-19-17 and pt called and cancelled appt. Pt called and resch appt for 02-22-17 and pt no showed for appt. Called ref office and lm on Vicente Males voicemail to call office back so office can give her a status on pt appt. Office number was provided.

## 2017-04-11 ENCOUNTER — Other Ambulatory Visit: Payer: Self-pay | Admitting: Family Medicine

## 2017-04-14 ENCOUNTER — Ambulatory Visit (INDEPENDENT_AMBULATORY_CARE_PROVIDER_SITE_OTHER): Payer: BLUE CROSS/BLUE SHIELD | Admitting: Family Medicine

## 2017-04-14 ENCOUNTER — Encounter: Payer: Self-pay | Admitting: Family Medicine

## 2017-04-14 VITALS — BP 138/82 | HR 67 | Temp 97.8°F | Resp 17 | Ht 75.0 in | Wt 240.2 lb

## 2017-04-14 DIAGNOSIS — F331 Major depressive disorder, recurrent, moderate: Secondary | ICD-10-CM

## 2017-04-14 DIAGNOSIS — G4700 Insomnia, unspecified: Secondary | ICD-10-CM

## 2017-04-14 DIAGNOSIS — I10 Essential (primary) hypertension: Secondary | ICD-10-CM

## 2017-04-14 DIAGNOSIS — L570 Actinic keratosis: Secondary | ICD-10-CM | POA: Diagnosis not present

## 2017-04-14 MED ORDER — AMLODIPINE BESYLATE 5 MG PO TABS: 5.0000 mg | ORAL_TABLET | Freq: Every day | ORAL | 3 refills | Status: DC

## 2017-04-14 MED ORDER — DOXEPIN HCL 25 MG PO CAPS: 25.0000 mg | ORAL_CAPSULE | Freq: Every day | ORAL | 3 refills | Status: DC

## 2017-04-14 MED ORDER — ALPRAZOLAM 1 MG PO TABS: 1.0000 mg | ORAL_TABLET | Freq: Every evening | ORAL | 5 refills | Status: DC | PRN

## 2017-04-14 MED ORDER — FLUOXETINE HCL 20 MG PO TABS: 20.0000 mg | ORAL_TABLET | Freq: Every day | ORAL | 3 refills | Status: DC

## 2017-04-14 NOTE — Patient Instructions (Addendum)
   IF you received an x-ray today, you will receive an invoice from [facility]. Please contact [facility] at [phone] with questions or concerns regarding your invoice.   IF you received labwork today, you will receive an invoice from [facility]. Please contact [facility] at [phone] with questions or concerns regarding your invoice.   Our billing staff will not be able to assist you with questions regarding bills from these companies.  You will be contacted with the lab results as soon as they are available. The fastest way to get your results is to activate your My Chart account. Instructions are located on the last page of this paperwork. If you have not heard from us regarding the results in 2 weeks, please contact this office.    Insomnia Insomnia is a sleep disorder that makes it difficult to fall asleep or to stay asleep. Insomnia can cause tiredness (fatigue), low energy, difficulty concentrating, mood swings, and poor performance at work or school. There are three different ways to classify insomnia:  Difficulty falling asleep.  Difficulty staying asleep.  Waking up too early in the morning.  Any type of insomnia can be long-term (chronic) or short-term (acute). Both are common. Short-term insomnia usually lasts for three months or less. Chronic insomnia occurs at least three times a week for longer than three months. What are the causes? Insomnia may be caused by another condition, situation, or substance, such as:  Anxiety.  Certain medicines.  Gastroesophageal reflux disease (GERD) or other gastrointestinal conditions.  Asthma or other breathing conditions.  Restless legs syndrome, sleep apnea, or other sleep disorders.  Chronic pain.  Menopause. This may include hot flashes.  Stroke.  Abuse of alcohol, tobacco, or illegal drugs.  Depression.  Caffeine.  Neurological disorders, such as Alzheimer disease.  An overactive thyroid  (hyperthyroidism).  The cause of insomnia may not be known. What increases the risk? Risk factors for insomnia include:  Gender. Women are more commonly affected than men.  Age. Insomnia is more common as you get older.  Stress. This may involve your professional or personal life.  Income. Insomnia is more common in people with lower income.  Lack of exercise.  Irregular work schedule or night shifts.  Traveling between different time zones.  What are the signs or symptoms? If you have insomnia, trouble falling asleep or trouble staying asleep is the main symptom. This may lead to other symptoms, such as:  Feeling fatigued.  Feeling nervous about going to sleep.  Not feeling rested in the morning.  Having trouble concentrating.  Feeling irritable, anxious, or depressed.  How is this treated? Treatment for insomnia depends on the cause. If your insomnia is caused by an underlying condition, treatment will focus on addressing the condition. Treatment may also include:  Medicines to help you sleep.  Counseling or therapy.  Lifestyle adjustments.  Follow these instructions at home:  Take medicines only as directed by your health care provider.  Keep regular sleeping and waking hours. Avoid naps.  Keep a sleep diary to help you and your health care provider figure out what could be causing your insomnia. Include: ? When you sleep. ? When you wake up during the night. ? How well you sleep. ? How rested you feel the next day. ? Any side effects of medicines you are taking. ? What you eat and drink.  Make your bedroom a comfortable place where it is easy to fall asleep: ? Put up shades or special blackout   curtains to block light from outside. ? Use a white noise machine to block noise. ? Keep the temperature cool.  Exercise regularly as directed by your health care provider. Avoid exercising right before bedtime.  Use relaxation techniques to manage stress. Ask  your health care provider to suggest some techniques that may work well for you. These may include: ? Breathing exercises. ? Routines to release muscle tension. ? Visualizing peaceful scenes.  Cut back on alcohol, caffeinated beverages, and cigarettes, especially close to bedtime. These can disrupt your sleep.  Do not overeat or eat spicy foods right before bedtime. This can lead to digestive discomfort that can make it hard for you to sleep.  Limit screen use before bedtime. This includes: ? Watching TV. ? Using your smartphone, tablet, and computer.  Stick to a routine. This can help you fall asleep faster. Try to do a quiet activity, brush your teeth, and go to bed at the same time each night.  Get out of bed if you are still awake after 15 minutes of trying to sleep. Keep the lights down, but try reading or doing a quiet activity. When you feel sleepy, go back to bed.  Make sure that you drive carefully. Avoid driving if you feel very sleepy.  Keep all follow-up appointments as directed by your health care provider. This is important. Contact a health care provider if:  You are tired throughout the day or have trouble in your daily routine due to sleepiness.  You continue to have sleep problems or your sleep problems get worse. Get help right away if:  You have serious thoughts about hurting yourself or someone else. This information is not intended to replace advice given to you by your health care provider. Make sure you discuss any questions you have with your health care provider. Document Released: 07/28/2000 Document Revised: 12/31/2015 Document Reviewed: 05/01/2014 Elsevier Interactive Patient Education  2018 [employer]  

## 2017-04-14 NOTE — Progress Notes (Signed)
Subjective:  By signing my name below, I, Tanner Webb, attest that this documentation has been prepared under the direction and in the presence of Tanner Cheadle, MD. Electronically Signed: Moises Webb, [place]. 04/14/2017 , 10:25 AM .  Patient was seen in Room 12 .   Patient ID: Tanner Webb, male    DOB: 04-16-1951, 66 y.o.   MRN: [id number] Chief Complaint  Patient presents with  . Mass    on left side of face that flakes & itches sometimes x 5 months  . Medication Refill    Xanax  . Flu Vaccine  . Depression    per triage screening   HPI Tanner Webb is a 66 y.o. male who presents to Primary Care at [facility] complaining of a scaling area on the left side of his face.   Depression Depression screen [facility] 2/9 04/14/2017 10/26/2016 09/16/2016 09/02/2016 12/18/2015  Decreased Interest 1 0 0 0 0  Down, Depressed, Hopeless 2 0 0 0 0  PHQ - 2 Score 3 0 0 0 0  Altered sleeping 3 - - - -  Tired, decreased energy 3 - - - -  Change in appetite 0 - - - -  Feeling bad or failure about yourself  1 - - - -  Trouble concentrating 0 - - - -  Moving slowly or fidgety/restless 0 - - - -  Suicidal thoughts 0 - - - -  PHQ-9 Score 10 - - - -  Difficult doing work/chores Somewhat difficult - - - -   The anniversary of his mother's passing is coming up, along with recent losses of his close friends has caused him to feel up and down. He's taken Lexapro in the past for a while with initial relief but it stopped working gradually. He also tried Prozac in the past and did well on it.   He's been taking xanax for 6-8 years for severe insomnia. He notes the amitriptyline made him feel antsy when going to sleep. He's been taking 2 tablets of xanax for the last few weeks.   Past Medical History:  Diagnosis Date  . Anxiety   . Atypical chest pain    a. 04/2015 Lexiscan MV: Ef 56%, no ischemia/infarct.  . Webb transfusion without reported diagnosis   . Chronic pain in left shoulder   . Hepatitis C  infection, cured    resolved after treatment wiht Harvoni  . Hyperlipidemia   . Hypertension   . RBBB    a. 04/2015 - first noted.   Prior to Admission medications   Medication Sig Start Date End Date Taking? Authorizing Provider  ALPRAZolam Tanner Webb) 1 MG tablet Take 1.5 tablets (1.5 mg total) by mouth at bedtime. 10/26/16  Yes Tanner Knapp, MD  amLODipine (NORVASC) 5 MG tablet Take 1 tablet (5 mg total) by mouth daily. 12/18/15  Yes Tanner Koyanagi, MD  aspirin EC 81 MG EC tablet Take 1 tablet (81 mg total) by mouth daily. 05/08/15  Yes Tanner Mire, NP   Allergies  Allergen Reactions  . Penicillins Swelling and Other (See Comments)    "leshions in my mouth"  . Sulfa Antibiotics Swelling   Past Surgical History:  Procedure Laterality Date  . CHOLECYSTECTOMY    . FRACTURE SURGERY    . HERNIA REPAIR    . LAPAROTOMY N/A 06/10/2014   Procedure: EXPLORATORY LAPAROTOMY, ventral hernia repair;  Surgeon: Tanner Mesa, MD;  Location: [facility];  Service: General;  Laterality: N/A;  .  SPLENECTOMY    . VENTRAL HERNIA REPAIR  06/10/2014   Family History  Problem Relation Age of Onset  . Mental illness Mother   . Alzheimer's disease Mother   . Heart disease Father   . Hypertension Father    Social History   Social History  . Marital status: Single    Spouse name: N/A  . Number of children: N/A  . Years of education: N/A   Social History Main Topics  . Smoking status: Former Smoker    Quit date: 04/30/2014  . Smokeless tobacco: Never Used  . Alcohol use No  . Drug use: No  . Sexual activity: Yes   Other Topics Concern  . None   Social History Narrative  . None   Depression screen [facility] 2/9 04/14/2017 10/26/2016 09/16/2016 09/02/2016 12/18/2015  Decreased Interest 1 0 0 0 0  Down, Depressed, Hopeless 2 0 0 0 0  PHQ - 2 Score 3 0 0 0 0  Altered sleeping 3 - - - -  Tired, decreased energy 3 - - - -  Change in appetite 0 - - - -  Feeling bad or failure about yourself  1 - - - -   Trouble concentrating 0 - - - -  Moving slowly or fidgety/restless 0 - - - -  Suicidal thoughts 0 - - - -  PHQ-9 Score 10 - - - -  Difficult doing work/chores Somewhat difficult - - - -    Review of Systems  Constitutional: Negative for fatigue and unexpected weight change.  Eyes: Negative for visual disturbance.  Respiratory: Negative for cough, chest tightness and shortness of breath.   Cardiovascular: Negative for chest pain, palpitations and leg swelling.  Gastrointestinal: Negative for abdominal pain and Webb in stool.  Skin: Negative for rash and wound.       Mass on face (left side)  Neurological: Negative for dizziness, light-headedness and headaches.  Psychiatric/Behavioral: Positive for dysphoric mood and sleep disturbance. Negative for self-injury and suicidal ideas. The patient is not nervous/anxious.        Objective:   Physical Exam  Constitutional: He is oriented to person, place, and time. He appears well-developed and well-nourished. No distress.  HENT:  Head: Normocephalic and atraumatic.  3-79mm rough scaly macule erythematous on the left side of his face  Eyes: Pupils are equal, round, and reactive to light. EOM are normal.  Neck: Neck supple.  Cardiovascular: Normal rate.   Pulmonary/Chest: Effort normal. No respiratory distress.  Musculoskeletal: Normal range of motion.  Neurological: He is alert and oriented to person, place, and time.  Skin: Skin is warm and dry.  Psychiatric: He has a normal mood and affect. His behavior is normal.  Nursing note and vitals reviewed.   BP (!) 143/87   Pulse 67   Temp 97.8 F (36.6 C) (Oral)   Resp 17   Ht 6\' 3"  (1.905 m)   Wt 240 lb 4 oz (109 kg)   SpO2 97%   BMI 30.03 kg/m       Assessment & Plan:   1. Actinic keratosis of left cheek - treated w/ cryosurg today approx 3x18mm lesion - pt tolerated well. Recheck at CPE in 2 wks to see if needs retreated.  2. Essential hypertension - refilled amlodipine 5 -  pt states he is taking but no rx in >18 mos  3. Insomnia, unspecified type - taking alprazolam 1.5mg  qhs for years - dependent. Req to increase to 2mg  qhs and  agrees to add in trial of doxepin - failed amitriptyline and trazodone  4. Moderate episode of recurrent major depressive disorder (HCC) - grieving losses at this time - responded well to prozac prior so will restart (did ok on lexapro for a short time).    Meds ordered this encounter  Medications  . ALPRAZolam (XANAX) 1 MG tablet    Sig: Take 1 tablet (1 mg total) by mouth at bedtime and may repeat dose one time if needed.    Dispense:  60 tablet    Refill:  5  . doxepin (SINEQUAN) 25 MG capsule    Sig: Take 1 capsule (25 mg total) by mouth at bedtime.    Dispense:  30 capsule    Refill:  3  . FLUoxetine (PROZAC) 20 MG tablet    Sig: Take 1 tablet (20 mg total) by mouth daily.    Dispense:  30 tablet    Refill:  3  . amLODipine (NORVASC) 5 MG tablet    Sig: Take 1 tablet (5 mg total) by mouth daily.    Dispense:  90 tablet    Refill:  3    I personally performed the services described in this documentation, which was scribed in my presence. The recorded information has been reviewed and considered, and addended by me as needed.   Tanner Webb, M.D.  Primary Care at [facility] [address] [place], [place] [address] [phone] phone [phone] fax  04/15/17 12:48 PM

## 2017-04-30 ENCOUNTER — Encounter: Payer: BLUE CROSS/BLUE SHIELD | Admitting: Family Medicine

## 2017-05-04 ENCOUNTER — Encounter: Payer: Self-pay | Admitting: Physician Assistant

## 2017-05-04 ENCOUNTER — Ambulatory Visit (INDEPENDENT_AMBULATORY_CARE_PROVIDER_SITE_OTHER): Payer: BLUE CROSS/BLUE SHIELD | Admitting: Physician Assistant

## 2017-05-04 VITALS — BP 121/78 | HR 63 | Temp 97.0°F | Resp 17 | Ht 75.5 in | Wt 233.0 lb

## 2017-05-04 DIAGNOSIS — R059 Cough, unspecified: Secondary | ICD-10-CM

## 2017-05-04 DIAGNOSIS — J069 Acute upper respiratory infection, unspecified: Secondary | ICD-10-CM | POA: Diagnosis not present

## 2017-05-04 DIAGNOSIS — R05 Cough: Secondary | ICD-10-CM | POA: Diagnosis not present

## 2017-05-04 MED ORDER — HYDROCODONE-HOMATROPINE 5-1.5 MG/5ML PO SYRP: 2.5000 mL | ORAL_SOLUTION | Freq: Every day | ORAL | 0 refills | Status: AC

## 2017-05-04 MED ORDER — PREDNISONE 50 MG PO TABS: ORAL_TABLET | ORAL | 0 refills | Status: DC

## 2017-05-04 MED ORDER — AZITHROMYCIN 250 MG PO TABS: ORAL_TABLET | ORAL | 0 refills | Status: DC

## 2017-05-04 NOTE — Progress Notes (Signed)
05/04/2017 11:03 AM   DOB: 1951-02-04 / MRN: [id number]  SUBJECTIVE:  Tanner Webb is a 66 y.o. male presenting for cough.  Smoked for about 7 years at one pack daily and then vaped for three years. Tells me has had some fever and chills. Mucinex without relief.  Feels that he is getting worse.   He is allergic to penicillins and sulfa antibiotics.   He  has a past medical history of Anxiety; Atypical chest pain; Blood transfusion without reported diagnosis; Chronic pain in left shoulder; Hepatitis C infection, cured; Hyperlipidemia; Hypertension; and RBBB.    He  reports that he quit smoking about 3 years ago. He has never used smokeless tobacco. He reports that he does not drink alcohol or use drugs. He  reports that he currently engages in sexual activity. The patient  has a past surgical history that includes Cholecystectomy; Splenectomy; Ventral hernia repair (06/10/2014); laparotomy (N/A, 06/10/2014); Fracture surgery; and Hernia repair.  His family history includes Alzheimer's disease in his mother; Heart disease in his father; Hypertension in his father; Mental illness in his mother.  Review of Systems  Constitutional: Negative for chills, diaphoresis and fever.  Eyes: Negative.   Respiratory: Negative for cough, hemoptysis, sputum production, shortness of breath and wheezing.   Cardiovascular: Negative for chest pain, orthopnea and leg swelling.  Gastrointestinal: Negative for nausea.  Skin: Negative for rash.  Neurological: Negative for dizziness, sensory change, speech change, focal weakness and headaches.    The problem list and medications were reviewed and updated by myself where necessary and exist elsewhere in the encounter.   OBJECTIVE:  BP 121/78   Pulse 63   Temp (!) 97 F (36.1 C) (Oral)   Resp 17   Ht 6' 3.5" (1.918 m)   Wt 233 lb (105.7 kg)   SpO2 98%   BMI 28.74 kg/m   Physical Exam  Constitutional: He appears well-developed. He is active and  cooperative.  Non-toxic appearance.  HENT:  Right Ear: Hearing, tympanic membrane, external ear and ear canal normal.  Left Ear: Hearing, tympanic membrane, external ear and ear canal normal.  Nose: Nose normal. Right sinus exhibits no maxillary sinus tenderness and no frontal sinus tenderness. Left sinus exhibits no maxillary sinus tenderness and no frontal sinus tenderness.  Mouth/Throat: Uvula is midline, oropharynx is clear and moist and mucous membranes are normal. No oropharyngeal exudate, posterior oropharyngeal edema or tonsillar abscesses.  Eyes: Pupils are equal, round, and reactive to light. Conjunctivae are normal.  Cardiovascular: Normal rate, regular rhythm, S1 normal, S2 normal, normal heart sounds, intact distal pulses and normal pulses.  Exam reveals no gallop and no friction rub.   No murmur heard. Pulmonary/Chest: Effort normal. No stridor. No tachypnea. No respiratory distress. He has no wheezes. He has no rales.  Abdominal: He exhibits no distension.  Musculoskeletal: He exhibits no edema.  Lymphadenopathy:       Head (right side): No submandibular and no tonsillar adenopathy present.       Head (left side): No submandibular and no tonsillar adenopathy present.    He has no cervical adenopathy.  Neurological: He is alert.  Skin: Skin is warm and dry. He is not diaphoretic. No pallor.  Vitals reviewed.     No results found for this or any previous visit (from the past 72 hour(s)).  No results found.  ASSESSMENT AND PLAN:  Tanner Webb was seen today for uri.  Diagnoses and all orders for this visit:  Protracted  URI  Cough Comments: Will cover for an atypical pneumonia.  Cough syrup and low dose steroid for symptomatic relief.  Orders: -     azithromycin (ZITHROMAX) 250 MG tablet; Take two one day one and one daily thereafter. -     predniSONE (DELTASONE) 50 MG tablet; Take one daily for three days. -     HYDROcodone-homatropine (HYCODAN) 5-1.5 MG/5ML syrup;  Take 2.5-5 mLs by mouth at bedtime. Do not mix with xanax.    The patient is advised to call or return to clinic if he does not see an improvement in symptoms, or to seek the care of the closest emergency department if he worsens with the above plan.   Philis Fendt, MHS, PA-C Primary Care at [facility] 05/04/2017 11:03 AM

## 2017-05-04 NOTE — Patient Instructions (Addendum)
I'm here all next week if you need me.     IF you received an x-ray today, you will receive an invoice from [facility]. Please contact [facility] at [phone] with questions or concerns regarding your invoice.   IF you received labwork today, you will receive an invoice from [facility]. Please contact [facility] at [phone] with questions or concerns regarding your invoice.   Our billing staff will not be able to assist you with questions regarding bills from these companies.  You will be contacted with the lab results as soon as they are available. The fastest way to get your results is to activate your My Chart account. Instructions are located on the last page of this paperwork. If you have not heard from [place] regarding the results in 2 weeks, please contact this office.

## 2017-07-19 ENCOUNTER — Encounter: Payer: BLUE CROSS/BLUE SHIELD | Admitting: Family Medicine

## 2017-07-19 ENCOUNTER — Ambulatory Visit: Payer: BLUE CROSS/BLUE SHIELD | Admitting: Family Medicine

## 2017-07-19 NOTE — Progress Notes (Deleted)
   Subjective:    Patient ID: Tanner Webb, male    DOB: June 27, 1951, 66 y.o.   MRN: [id number]  HPI    Review of Systems     Objective:   Physical Exam        Assessment & Plan:

## 2017-07-25 ENCOUNTER — Ambulatory Visit (INDEPENDENT_AMBULATORY_CARE_PROVIDER_SITE_OTHER): Payer: BLUE CROSS/BLUE SHIELD | Admitting: Family Medicine

## 2017-07-25 ENCOUNTER — Other Ambulatory Visit: Payer: Self-pay

## 2017-07-25 ENCOUNTER — Encounter: Payer: Self-pay | Admitting: Family Medicine

## 2017-07-25 VITALS — BP 120/80 | HR 77 | Temp 97.6°F | Resp 16 | Ht 75.5 in | Wt 242.4 lb

## 2017-07-25 DIAGNOSIS — Z1211 Encounter for screening for malignant neoplasm of colon: Secondary | ICD-10-CM

## 2017-07-25 DIAGNOSIS — G4700 Insomnia, unspecified: Secondary | ICD-10-CM

## 2017-07-25 DIAGNOSIS — Z23 Encounter for immunization: Secondary | ICD-10-CM | POA: Diagnosis not present

## 2017-07-25 DIAGNOSIS — Z125 Encounter for screening for malignant neoplasm of prostate: Secondary | ICD-10-CM | POA: Diagnosis not present

## 2017-07-25 DIAGNOSIS — Z0000 Encounter for general adult medical examination without abnormal findings: Secondary | ICD-10-CM

## 2017-07-25 DIAGNOSIS — Z130 Encounter for screening for diseases of the blood and blood-forming organs and certain disorders involving the immune mechanism: Secondary | ICD-10-CM | POA: Diagnosis not present

## 2017-07-25 DIAGNOSIS — Z136 Encounter for screening for cardiovascular disorders: Secondary | ICD-10-CM

## 2017-07-25 DIAGNOSIS — Z119 Encounter for screening for infectious and parasitic diseases, unspecified: Secondary | ICD-10-CM | POA: Diagnosis not present

## 2017-07-25 DIAGNOSIS — Z1389 Encounter for screening for other disorder: Secondary | ICD-10-CM | POA: Diagnosis not present

## 2017-07-25 DIAGNOSIS — Z1383 Encounter for screening for respiratory disorder NEC: Secondary | ICD-10-CM | POA: Diagnosis not present

## 2017-07-25 DIAGNOSIS — I10 Essential (primary) hypertension: Secondary | ICD-10-CM

## 2017-07-25 DIAGNOSIS — Z1212 Encounter for screening for malignant neoplasm of rectum: Secondary | ICD-10-CM

## 2017-07-25 DIAGNOSIS — R350 Frequency of micturition: Secondary | ICD-10-CM

## 2017-07-25 DIAGNOSIS — Z720 Tobacco use: Secondary | ICD-10-CM

## 2017-07-25 DIAGNOSIS — R600 Localized edema: Secondary | ICD-10-CM

## 2017-07-25 LAB — POCT URINALYSIS DIP (MANUAL ENTRY)
Bilirubin, UA: NEGATIVE
Blood, UA: NEGATIVE
GLUCOSE UA: NEGATIVE mg/dL
Ketones, POC UA: NEGATIVE mg/dL
NITRITE UA: NEGATIVE
Protein Ur, POC: NEGATIVE mg/dL
Spec Grav, UA: <=1.005 — AB (ref 1.010–1.025)
UROBILINOGEN UA: 0.2 U/dL
pH, UA: 6 (ref 5.0–8.0)

## 2017-07-25 MED ORDER — DOXEPIN HCL 25 MG PO CAPS: 25.0000 mg | ORAL_CAPSULE | Freq: Every day | ORAL | 3 refills | Status: DC

## 2017-07-25 NOTE — Patient Instructions (Addendum)
IF you received an x-ray today, you will receive an invoice from [facility]. Please contact [facility] at [phone] with questions or concerns regarding your invoice.   IF you received labwork today, you will receive an invoice from [facility]. Please contact [facility] at [phone] with questions or concerns regarding your invoice.   Our billing staff will not be able to assist you with questions regarding bills from these companies.  You will be contacted with the lab results as soon as they are available. The fastest way to get your results is to activate your My Chart account. Instructions are located on the last page of this paperwork. If you have not heard from [place] regarding the results in 2 weeks, please contact this office.      Peripheral Edema Peripheral edema is swelling that is caused by a buildup of fluid. Peripheral edema most often affects the lower legs, ankles, and feet. It can also develop in the arms, hands, and face. The area of the body that has peripheral edema will look swollen. It may also feel heavy or warm. Your clothes may start to feel tight. Pressing on the area may make a temporary dent in your skin. You may not be able to move your arm or leg as much as usual. There are many causes of peripheral edema. It can be a complication of other diseases, such as congestive heart failure, kidney disease, or a problem with your blood circulation. It also can be a side effect of certain medicines. It often happens to women during pregnancy. Sometimes, the cause is not known. Treating the underlying condition is often the only treatment for peripheral edema. Follow these instructions at home: Pay attention to any changes in your symptoms. Take these actions to help with your discomfort:  Raise (elevate) your legs while you are sitting or lying down.  Move around often to prevent stiffness and to lessen swelling. Do not sit or stand for long periods of  time.  Wear support stockings as told by your health care provider.  Follow instructions from your health care provider about limiting salt (sodium) in your diet. Sometimes eating less salt can reduce swelling.  Take over-the-counter and prescription medicines only as told by your health care provider. Your health care provider may prescribe medicine to help your body get rid of excess water (diuretic).  Keep all follow-up visits as told by your health care provider. This is important.  Contact a health care provider if:  You have a fever.  Your edema starts suddenly or is getting worse, especially if you are pregnant or have a medical condition.  You have swelling in only one leg.  You have increased swelling and pain in your legs. Get help right away if:  You develop shortness of breath, especially when you are lying down.  You have pain in your chest or abdomen.  You feel weak.  You faint. This information is not intended to replace advice given to you by your health care provider. Make sure you discuss any questions you have with your health care provider. Document Released: 09/07/2004 Document Revised: 01/03/2016 Document Reviewed: 02/10/2015 Elsevier Interactive Patient Education  2018 [place] Maintenance, Male A healthy lifestyle and preventive care is important for your health and wellness. Ask your health care provider about what schedule of regular examinations is right for you. What should I know about weight and diet? Eat a Healthy Diet  Eat plenty of vegetables, fruits, whole grains,  low-fat dairy products, and lean protein.  Do not eat a lot of foods high in solid fats, added sugars, or salt.  Maintain a Healthy Weight Regular exercise can help you achieve or maintain a healthy weight. You should:  Do at least 150 minutes of exercise each week. The exercise should increase your heart rate and make you sweat (moderate-intensity exercise).  Do  strength-training exercises at least twice a week.  Watch Your Levels of Cholesterol and Blood Lipids  Have your blood tested for lipids and cholesterol every 5 years starting at 64 years of age. If you are at high risk for heart disease, you should start having your blood tested when you are 64 years old. You may need to have your cholesterol levels checked more often if: ? Your lipid or cholesterol levels are high. ? You are older than 64 years of age. ? You are at high risk for heart disease.  What should I know about cancer screening? Many types of cancers can be detected early and may often be prevented. Lung Cancer  You should be screened every year for lung cancer if: ? You are a current smoker who has smoked for at least 30 years. ? You are a former smoker who has quit within the past 15 years.  Talk to your health care provider about your screening options, when you should start screening, and how often you should be screened.  Colorectal Cancer  Routine colorectal cancer screening usually begins at 64 years of age and should be repeated every 5-10 years until you are 64 years old. You may need to be screened more often if early forms of precancerous polyps or small growths are found. Your health care provider may recommend screening at an earlier age if you have risk factors for colon cancer.  Your health care provider may recommend using home test kits to check for hidden blood in the stool.  A small camera at the end of a tube can be used to examine your colon (sigmoidoscopy or colonoscopy). This checks for the earliest forms of colorectal cancer.  Prostate and Testicular Cancer  Depending on your age and overall health, your health care provider may do certain tests to screen for prostate and testicular cancer.  Talk to your health care provider about any symptoms or concerns you have about testicular or prostate cancer.  Skin Cancer  Check your skin from head to toe  regularly.  Tell your health care provider about any new moles or changes in moles, especially if: ? There is a change in a mole's size, shape, or color. ? You have a mole that is larger than a pencil eraser.  Always use sunscreen. Apply sunscreen liberally and repeat throughout the day.  Protect yourself by wearing long sleeves, pants, a wide-brimmed hat, and sunglasses when outside.  What should I know about heart disease, diabetes, and high blood pressure?  If you are 64-37 years of age, have your blood pressure checked every 3-5 years. If you are 19 years of age or older, have your blood pressure checked every year. You should have your blood pressure measured twice-once when you are at a hospital or clinic, and once when you are not at a hospital or clinic. Record the average of the two measurements. To check your blood pressure when you are not at a hospital or clinic, you can use: ? An automated blood pressure machine at a pharmacy. ? A home blood pressure monitor.  Talk to  your health care provider about your target blood pressure.  If you are between 49-35 years old, ask your health care provider if you should take aspirin to prevent heart disease.  Have regular diabetes screenings by checking your fasting blood sugar level. ? If you are at a normal weight and have a low risk for diabetes, have this test once every three years after the age of 80. ? If you are overweight and have a high risk for diabetes, consider being tested at a younger age or more often.  A one-time screening for abdominal aortic aneurysm (AAA) by ultrasound is recommended for men aged 25-75 years who are current or former smokers. What should I know about preventing infection? Hepatitis B If you have a higher risk for hepatitis B, you should be screened for this virus. Talk with your health care provider to find out if you are at risk for hepatitis B infection. Hepatitis C Blood testing is recommended  for:  Everyone born from [birth year] through 1963.  Anyone with known risk factors for hepatitis C.  Sexually Transmitted Diseases (STDs)  You should be screened each year for STDs including gonorrhea and chlamydia if: ? You are sexually active and are younger than 64 years of age. ? You are older than 64 years of age and your health care provider tells you that you are at risk for this type of infection. ? Your sexual activity has changed since you were last screened and you are at an increased risk for chlamydia or gonorrhea. Ask your health care provider if you are at risk.  Talk with your health care provider about whether you are at high risk of being infected with HIV. Your health care provider may recommend a prescription medicine to help prevent HIV infection.  What else can I do?  Schedule regular health, dental, and eye exams.  Stay current with your vaccines (immunizations).  Do not use any tobacco products, such as cigarettes, chewing tobacco, and e-cigarettes. If you need help quitting, ask your health care provider.  Limit alcohol intake to no more than 2 drinks per day. One drink equals 12 ounces of beer, 5 ounces of wine, or 1 ounces of hard liquor.  Do not use street drugs.  Do not share needles.  Ask your health care provider for help if you need support or information about quitting drugs.  Tell your health care provider if you often feel depressed.  Tell your health care provider if you have ever been abused or do not feel safe at home. This information is not intended to replace advice given to you by your health care provider. Make sure you discuss any questions you have with your health care provider. Document Released: 01/27/2008 Document Revised: 03/29/2016 Document Reviewed: 05/04/2015 Elsevier Interactive Patient Education  [employer].

## 2017-07-25 NOTE — Progress Notes (Addendum)
Subjective:  By signing my name below, I, Essence Howell, attest that this documentation has been prepared under the direction and in the presence of Delman Cheadle, MD Electronically Signed: Ladene Artist, ED Scribe 07/25/2017 at 3:41 PM.   Patient ID: Lin Givens, male    DOB: July 17, 1951, 66 y.o.   MRN: [id number]  Chief Complaint  Patient presents with  . Annual Exam   HPI MATEEN FRANSSEN is a 66 y.o. male who presents to Primary Care at [facility] for an annual exam. Pt is fasting at this visit.  Primary Preventative Screenings: Prostate Cancer: Pt does not see an urologist STI screening: declines screening at this visit  Colorectal Cancer: colonoscopy done 01/05/15 at [facility] by Dr. Nyoka Cowden, 1 polyp in descending colon, plastic and hyperplastic polyp in sigmoid  Tobacco use/AAA/Lung/EtOH/Illicit substances: pt is a former smoker; started at age 27 x 76 yrs Cardiac: 04/2015 lexiscan EF 56%, no ischemia, EKG 05/08/15, seen by Dr. Harl Bowie 07/02/15 f/u as needed Weight/Blood sugar/Diet/Exercise: BMI Readings from Last 3 Encounters:  07/25/17 29.90 kg/m  05/04/17 28.74 kg/m  04/14/17 30.03 kg/m   No results found for: HGBA1C OTC/Vit/Supp/Herbal: Dentist/Optho: Immunizations: labs 06/02/14 show not immune to Hep B, only received 1 dose of vaccine  Immunization History  Administered Date(s) Administered  . Hepatitis A 06/08/2008, 01/01/2009  . Hepatitis B, adult 06/08/2014  . Influenza Split 05/15/2012  . Influenza,inj,Quad PF,6+ Mos 06/08/2014, 06/30/2015  . Meningococcal Conjugate 03/30/2012  . Pneumococcal Polysaccharide-23 12/12/2008  . Tdap 09/14/2009   Insomnia  Pt states that he has been sleeping for 5+ hours/night since starting doxepin.  Nocturia Pt states that he gets up to urinate 3-4 times/night but he does report drinking a lot at night. States he drinks 1 L of water and 1 L of Diet [employer] a day. Denies increased appetite, difficulty starting stream.  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 He noticed swelling to his feet, ankles and legs that started to resolve after 5 days.  Chronic Medical Conditions:  Past Medical History:  Diagnosis Date  . Anxiety   . Atypical chest pain    a. 04/2015 Lexiscan MV: Ef 56%, no ischemia/infarct.  . Blood transfusion without reported diagnosis   . Chronic pain in left shoulder   . Hepatitis C infection, cured    resolved after treatment wiht Harvoni  . Hyperlipidemia   . Hypertension   . RBBB    a. 04/2015 - first noted.   Current Outpatient Medications on File Prior to Visit  Medication Sig Dispense Refill  . ALPRAZolam (XANAX) 1 MG tablet Take 1 tablet (1 mg total) by mouth at bedtime and may repeat dose one time if needed. 60 tablet 5  . amLODipine (NORVASC) 5 MG tablet Take 1 tablet (5 mg total) by mouth daily. 90 tablet 3  . aspirin EC 81 MG EC tablet Take 1 tablet (81 mg total) by mouth daily.    Marland Kitchen doxepin (SINEQUAN) 25 MG capsule Take 1 capsule (25 mg total) by mouth at bedtime. 30 capsule 3  . azithromycin (ZITHROMAX) 250 MG tablet Take two one day one and one daily thereafter. (Patient not taking: Reported on 07/25/2017) 6 tablet 0  . FLUoxetine (PROZAC) 20 MG tablet Take 1 tablet (20 mg total) by mouth daily. (Patient not taking: Reported on 05/04/2017) 30 tablet 3  . predniSONE (DELTASONE) 50 MG tablet Take one daily for three days. (Patient not  taking: Reported on 07/25/2017) 3 tablet 0   No current facility-administered medications on file prior to visit.    Allergies  Allergen Reactions  . Penicillins Swelling and Other (See Comments)    "leshions in my mouth"  . Sulfa Antibiotics Swelling   Past Surgical History:  Procedure Laterality Date  . CHOLECYSTECTOMY    . FRACTURE SURGERY    . HERNIA REPAIR    . LAPAROTOMY N/A 06/10/2014   Procedure: EXPLORATORY LAPAROTOMY, ventral hernia repair;  Surgeon: Donnie Mesa,  MD;  Location: [place];  Service: General;  Laterality: N/A;  . SPLENECTOMY    . VENTRAL HERNIA REPAIR  06/10/2014   Family History  Problem Relation Age of Onset  . Mental illness Mother   . Alzheimer's disease Mother   . Heart disease Father   . Hypertension Father    Social History   Socioeconomic History  . Marital status: Single    Spouse name: None  . Number of children: None  . Years of education: None  . Highest education level: None  Social Needs  . Financial resource strain: None  . Food insecurity - worry: None  . Food insecurity - inability: None  . Transportation needs - medical: None  . Transportation needs - non-medical: None  Occupational History  . None  Tobacco Use  . Smoking status: Former Smoker    Last attempt to quit: 04/30/2014    Years since quitting: 3.2  . Smokeless tobacco: Never Used  Substance and Sexual Activity  . Alcohol use: No    Alcohol/week: 0.0 oz  . Drug use: No  . Sexual activity: Yes  Other Topics Concern  . None  Social History Narrative  . None   Depression screen [facility] 2/9 07/25/2017 05/04/2017 04/14/2017 10/26/2016 09/16/2016  Decreased Interest 0 0 1 0 0  Down, Depressed, Hopeless 0 0 2 0 0  PHQ - 2 Score 0 0 3 0 0  Altered sleeping - 0 3 - -  Tired, decreased energy - 0 3 - -  Change in appetite - 0 0 - -  Feeling bad or failure about yourself  - 0 1 - -  Trouble concentrating - 0 0 - -  Moving slowly or fidgety/restless - 0 0 - -  Suicidal thoughts - 0 0 - -  PHQ-9 Score - 0 10 - -  Difficult doing work/chores - Not difficult at all Somewhat difficult - -    Review of Systems  Constitutional: Negative for appetite change.  HENT: Positive for facial swelling.   Cardiovascular: Positive for leg swelling (and feet).  Genitourinary: Positive for frequency. Negative for difficulty urinating.  Musculoskeletal: Positive for joint swelling.  Psychiatric/Behavioral: Positive for dysphoric mood. Negative for sleep disturbance.    All other systems reviewed and are negative.     Objective:   Physical Exam  Constitutional: He is oriented to person, place, and time. He appears well-developed and well-nourished. No distress.  HENT:  Head: Normocephalic and atraumatic.  Right Ear: Tympanic membrane, external ear and ear canal normal.  Left Ear: Tympanic membrane, external ear and ear canal normal.  Nose: Nose normal.  Mouth/Throat: Uvula is midline, oropharynx is clear and moist and mucous membranes are normal. No oropharyngeal exudate.  Eyes: Conjunctivae and EOM are normal. Right eye exhibits no discharge. Left eye exhibits no discharge. No scleral icterus.  Neck: Normal range of motion. Neck supple. No tracheal deviation present. No thyromegaly present.  Cardiovascular: Normal rate, regular rhythm, normal  heart sounds and intact distal pulses.  Pulmonary/Chest: Effort normal and breath sounds normal. No respiratory distress.  Abdominal: Soft. Bowel sounds are normal. He exhibits no distension and no mass. There is no tenderness. There is no rebound and no guarding.  Genitourinary: Rectum normal and prostate normal. Prostate is not enlarged and not tender.  Genitourinary Comments: Chaperone present.  Musculoskeletal: Normal range of motion. He exhibits edema.  1+ pitting edema.  Lymphadenopathy:    He has no cervical adenopathy.  Neurological: He is alert and oriented to person, place, and time. He has normal reflexes. No cranial nerve deficit. He exhibits normal muscle tone.  Skin: Skin is warm and dry. No rash noted. He is not diaphoretic. No erythema.  Psychiatric: He has a normal mood and affect. His behavior is normal.  Nursing note and vitals reviewed.  BP 120/80   Pulse 77   Temp 97.6 F (36.4 C)   Resp 16   Ht 6' 3.5" (1.918 m)   Wt 242 lb 6.4 oz (110 kg)   SpO2 99%   BMI 29.90 kg/m      Visual Acuity Screening   Right eye Left eye Both eyes  Without correction: 20/30 20/40 20/30   With  correction:       EKG: NSR, no acute ischemic changes noted. No significant change noted when compared to prior EKG done 05/08/15 I have personally reviewed the EKG tracing and agree with the computer interpretation of RBBB.  Assessment & Plan:  Needs Tdap next year - pt ok to do this year. Pt does needs Hep b #2 since not immune on last labs sev yrs ago - ordered but appears he did not receive today - do at f/u.  1. Annual physical exam   2. Screening for cardiovascular, respiratory, and genitourinary diseases   3. Screening for colorectal cancer   4. Screening for prostate cancer   5. Screening for deficiency anemia   6. Tobacco use   7. Pedal edema - suspect due to amlodipine but is mild and self-limited so pt elects for watchful waiting and NOT changing BP med at this time - will let me know if worsens.  8. Screening examination for infectious disease   9. Urinary frequency   10.    HTN - perfect control, cont amlodipine 5. 11.    Insomnia - doing great on current combo of alprazolam 1-2mg  qhs and doxepin. Will be due for alprazolam refill 10/12/2016 so ok to send in 3 mo refill of #60/mo at that time. Recheck in f/u OV in 6 mos.   Orders Placed This Encounter  Procedures  . Tdap vaccine greater than or equal to 7yo IM  . Hepatitis B vaccine adult IM  . HIV antibody  . Comprehensive metabolic panel    Order Specific Question:   Has the patient fasted?    Answer:   Yes  . Lipid panel    Order Specific Question:   Has the patient fasted?    Answer:   Yes  . PSA  . CBC with Differential/Platelet  . TSH  . Hemoglobin A1c  . Hemoglobin A1c  . POCT urinalysis dipstick  . EKG 12-Lead    Meds ordered this encounter  Medications  . doxepin (SINEQUAN) 25 MG capsule    Sig: Take 1 capsule (25 mg total) by mouth at bedtime.    Dispense:  90 capsule    Refill:  3    I personally performed the services described in  this documentation, which was scribed in my presence. The  recorded information has been reviewed and considered, and addended by me as needed.   Delman Cheadle, M.D.  Primary Care at [facility] [address] [place], [place] [address] [phone] phone [phone] fax  07/27/17 7:34 PM

## 2017-07-26 LAB — COMPREHENSIVE METABOLIC PANEL
ALT: 21 IU/L (ref 0–44)
AST: 20 IU/L (ref 0–40)
Albumin/Globulin Ratio: 1.7 (ref 1.2–2.2)
Albumin: 4.2 g/dL (ref 3.6–4.8)
Alkaline Phosphatase: 59 IU/L (ref 39–117)
BUN/Creatinine Ratio: 13 (ref 10–24)
BUN: 11 mg/dL (ref 8–27)
Bilirubin Total: 0.5 mg/dL (ref 0.0–1.2)
CO2: 24 mmol/L (ref 20–29)
Calcium: 9.3 mg/dL (ref 8.6–10.2)
Chloride: 103 mmol/L (ref 96–106)
Creatinine, Ser: 0.84 mg/dL (ref 0.76–1.27)
GFR calc non Af Amer: 93 mL/min/{1.73_m2} (ref >59)
GFR, EST AFRICAN AMERICAN: 108 mL/min/{1.73_m2} (ref >59)
GLUCOSE: 88 mg/dL (ref 65–99)
Globulin, Total: 2.5 g/dL (ref 1.5–4.5)
Potassium: 4.7 mmol/L (ref 3.5–5.2)
Sodium: 139 mmol/L (ref 134–144)
TOTAL PROTEIN: 6.7 g/dL (ref 6.0–8.5)

## 2017-07-26 LAB — CBC WITH DIFFERENTIAL/PLATELET
BASOS: 1 %
Basophils Absolute: 0.1 10*3/uL (ref 0.0–0.2)
EOS (ABSOLUTE): 0.3 10*3/uL (ref 0.0–0.4)
EOS: 4 %
HEMATOCRIT: 40.6 % (ref 37.5–51.0)
HEMOGLOBIN: 13.9 g/dL (ref 13.0–17.7)
Immature Grans (Abs): 0 10*3/uL (ref 0.0–0.1)
Immature Granulocytes: 0 %
LYMPHS ABS: 2.7 10*3/uL (ref 0.7–3.1)
Lymphs: 39 %
MCH: 30.9 pg (ref 26.6–33.0)
MCHC: 34.2 g/dL (ref 31.5–35.7)
MCV: 90 fL (ref 79–97)
MONOCYTES: 14 %
Monocytes Absolute: 1 10*3/uL — ABNORMAL HIGH (ref 0.1–0.9)
NEUTROS ABS: 2.8 10*3/uL (ref 1.4–7.0)
Neutrophils: 42 %
Platelets: 475 10*3/uL — ABNORMAL HIGH (ref 150–379)
RBC: 4.5 x10E6/uL (ref 4.14–5.80)
RDW: 14.5 % (ref 12.3–15.4)
WBC: 6.8 10*3/uL (ref 3.4–10.8)

## 2017-07-26 LAB — LIPID PANEL
CHOLESTEROL TOTAL: 158 mg/dL (ref 100–199)
Chol/HDL Ratio: 3.4 ratio (ref 0.0–5.0)
HDL: 47 mg/dL (ref >39)
LDL Calculated: 100 mg/dL — ABNORMAL HIGH (ref 0–99)
TRIGLYCERIDES: 57 mg/dL (ref 0–149)
VLDL CHOLESTEROL CAL: 11 mg/dL (ref 5–40)

## 2017-07-26 LAB — HIV ANTIBODY (ROUTINE TESTING W REFLEX): HIV SCREEN 4TH GENERATION: NONREACTIVE

## 2017-07-26 LAB — HEMOGLOBIN A1C
Est. average glucose Bld gHb Est-mCnc: 111 mg/dL
Hgb A1c MFr Bld: 5.5 % (ref 4.8–5.6)

## 2017-07-26 LAB — PSA: PROSTATE SPECIFIC AG, SERUM: 0.4 ng/mL (ref 0.0–4.0)

## 2017-07-26 LAB — TSH: TSH: 1.68 u[IU]/mL (ref 0.450–4.500)

## 2017-08-02 ENCOUNTER — Telehealth: Payer: Self-pay | Admitting: Family Medicine

## 2017-08-02 NOTE — Telephone Encounter (Signed)
Copied from [place]. Topic: Quick Communication - Lab Results >> Aug 02, 2017 12:20 PM Robina Ade, Helene Kelp D wrote: Patient called and has question about his labs results. They are not released for the our nurse triage to discuss. Please call patient back, thanks.

## 2017-08-03 ENCOUNTER — Encounter: Payer: Self-pay | Admitting: Family Medicine

## 2017-08-03 DIAGNOSIS — G4700 Insomnia, unspecified: Secondary | ICD-10-CM | POA: Insufficient documentation

## 2017-08-03 NOTE — Telephone Encounter (Signed)
I called pt and let him know that his labs look great.  He will cancel this visit he has sched Sat 12/22 with me to discuss them.

## 2017-08-04 ENCOUNTER — Ambulatory Visit: Payer: BLUE CROSS/BLUE SHIELD | Admitting: Family Medicine

## 2017-08-23 ENCOUNTER — Ambulatory Visit (INDEPENDENT_AMBULATORY_CARE_PROVIDER_SITE_OTHER): Payer: BLUE CROSS/BLUE SHIELD | Admitting: Family Medicine

## 2017-08-23 ENCOUNTER — Encounter: Payer: Self-pay | Admitting: Family Medicine

## 2017-08-23 ENCOUNTER — Ambulatory Visit (INDEPENDENT_AMBULATORY_CARE_PROVIDER_SITE_OTHER): Payer: BLUE CROSS/BLUE SHIELD

## 2017-08-23 VITALS — BP 120/70 | HR 79 | Temp 97.6°F | Resp 16 | Ht 75.5 in | Wt 241.4 lb

## 2017-08-23 DIAGNOSIS — S83411A Sprain of medial collateral ligament of right knee, initial encounter: Secondary | ICD-10-CM

## 2017-08-23 DIAGNOSIS — M25561 Pain in right knee: Secondary | ICD-10-CM | POA: Diagnosis not present

## 2017-08-23 MED ORDER — HYDROCODONE-ACETAMINOPHEN 10-325 MG PO TABS: 1.0000 | ORAL_TABLET | ORAL | 0 refills | Status: DC | PRN

## 2017-08-23 NOTE — Patient Instructions (Addendum)
IF you received an x-ray today, you will receive an invoice from [facility]. Please contact [facility] at [phone] with questions or concerns regarding your invoice.   IF you received labwork today, you will receive an invoice from [facility]. Please contact [facility] at [phone] with questions or concerns regarding your invoice.   Our billing staff will not be able to assist you with questions regarding bills from these companies.  You will be contacted with the lab results as soon as they are available. The fastest way to get your results is to activate your My Chart account. Instructions are located on the last page of this paperwork. If you have not heard from [place] regarding the results in 2 weeks, please contact this office.     Medial Collateral Knee Ligament Sprain The medial collateral ligament (MCL) is a tough band of tissue in the knee that connects the thigh bone to the shin bone. Your MCL prevents your knee from moving too far inward and helps to keep your knee stable. An MCL sprain is an injury that is caused by stretching the MCL too far. The injury can involve a tear in the MCL. What are the causes? This condition may be caused by:  A hard, direct hit (blow) to the inside of your knee (common).  Your knee falling inward when you run, change directions quickly (cut), jump, or pivot.  Repeatedly overstretching the MCL.  What increases the risk? The following factors make you more likely to develop this condition:  Playing contact sports, such as wrestling or football.  Participating in sports that involve cutting, like hockey, skiing, or soccer.  Having weak hip and core muscles.  What are the signs or symptoms? Symptoms of this condition include:  A popping sound at the time of injury.  Pain on the inside of the knee.  Swelling in the knee.  Bruising around the knee.  Tenderness when pressing the inside of the knee.  Feeling  unstable when you stand, like your knee will give way.  Difficulty walking on uneven surfaces.  How is this diagnosed? This condition may be diagnosed based on:  Your medical history.  A physical exam.  Tests, such as an X-ray or MRI.  During your physical exam, your health care provider will check for pain, limited motion, and instability. How is this treated? Treatment for this condition depends on how severe the injury is. Treatment may include:  Keeping weight off the knee until swelling and pain improve.  Raising (elevating) the knee above the level of your heart. This helps to reduce swelling.  Icing the knee. This helps to reduce swelling.  Taking an NSAID. This helps to reduce pain and swelling.  Using a knee brace, elastic sleeve, or crutches while the injury heals.  Using a knee brace when participating in athletic activities.  Doing rehab exercises (physical therapy).  Surgery. This may be needed if: ? Your MCL tore all the way through. ? Your knee is unstable. ? Your knee is not getting better with other treatments.  Follow these instructions at home: If you have a brace or sleeve:  Wear it as told by your health care provider. Remove it only as told by your health care provider.  Loosen the brace or remove the sleeve if your toes tingle, become numb, or turn cold and blue.  Do not let your brace or sleeve get wet if it is not waterproof.  Keep the brace or sleeve  clean. Managing pain, stiffness, and swelling  If directed, apply ice to the inside of your knee. ? Put ice in a plastic bag. ? Place a towel between your skin and the bag. ? Leave the ice on for 20 minutes, 2-3 times a day.  Move your foot and toes often to avoid stiffness and to lessen swelling.  Elevate your knee above the level of your heart while you are sitting or lying down. Driving  Ask your health care provider when it is safe to drive if you have a brace or sleeve on your  leg. Activity  Return to your normal activities as told by your health care provider. Ask your health care provider what activities are safe for you.  Do exercises as told by your health care provider. Safety  Do not use the injured limb to support your body weight until your health care provider says that you can. Use crutches as told by your health care provider. General instructions  Take over-the-counter and prescription medicines only as told by your health care provider.  Keep all follow-up visits as told by your health care provider. This is important. How is this prevented?  Warm up and stretch before being active.  Cool down and stretch after being active.  Give your body time to rest between periods of activity.  Make sure to use equipment that fits you.  Be safe and responsible while being active to avoid falls.  Do at least 150 minutes of moderate-intensity exercise each week, such as brisk walking or water aerobics.  Maintain physical fitness, including: ? Strength. ? Flexibility. ? Cardiovascular fitness. ? Endurance. Contact a health care provider if:  Your symptoms do not improve.  Your symptoms get worse. This information is not intended to replace advice given to you by your health care provider. Make sure you discuss any questions you have with your health care provider. Document Released: 07/31/2005 Document Revised: 04/04/2016 Document Reviewed: 06/12/2015 Elsevier Interactive Patient Education  2018 [employer].  Medial Collateral Knee Ligament Sprain, Phase I Rehab Ask your health care provider which exercises are safe for you. Do exercises exactly as told by your health care provider and adjust them as directed. It is normal to feel mild stretching, pulling, tightness, or discomfort as you do these exercises, but you should stop right away if you feel sudden pain or your pain gets worse.Do not begin these exercises until told by your health care  provider. Stretching and range of motion exercises These exercises warm up your muscles and joints and improve the movement and flexibility of your knee. These exercises also help to relieve pain, numbness, and tingling. Exercise A: Knee flexion, passive 1. Start this exercise in one of these positions: ? Lying on the floor in front of an open doorway, with your left / right heel and foot lightly touching the wall. ? Lying on the floor with both feet on the wall. 2. Without using any effort, allow gravity to let your foot slide down the wall slowly until you feel a gentle stretch in the front of your left / right knee. 3. Hold this stretch for __________ seconds. 4. Return your leg to the starting position, using your healthy leg to do the work or to help if needed. Repeat __________ times. Complete this stretch __________ times a day. Exercise B: Knee flexion, active  1. Lie on your back with both knees straight. If this causes back discomfort, bend your healthy knee so your foot  is flat on the floor. 2. Slowly slide your left / right heel back toward your buttocks until you feel a gentle stretch in the front of your knee or thigh. 3. Hold this position for __________ seconds. 4. Slowly slide your left / right heel back to the starting position. Repeat __________ times. Complete this exercise __________ times a day. Exercise C: Knee extension, sitting 1. Sit with your left / right heel propped on a chair, a coffee table, or a footstool. Do not have anything under your knee to support it. 2. Allow your leg muscles to relax, letting gravity straighten out your knee. Do not let your knee roll inward. You should feel a stretch behind your left / right knee. 3. If told by your health care provider, deepen the stretch by placing a __________ weight on your thigh, just above your kneecap. 4. Hold this position for __________ seconds. Repeat __________ times. Complete this stretch __________ times a  day. Strengthening exercises These exercises build strength and endurance in your knee. Endurance is the ability to use your muscles for a long time, even after they get tired. Isometric exercises involve squeezing your muscles but not moving your knee. Exercise D: Quadriceps, isometric  1. Lie on your back with your left / right leg extended and your other knee bent. 2. If told by your health care provider, put a rolled towel or small pillow under your left / right knee. 3. Slowly tense the muscles in the front of your left / right thigh by pushing your knee down. You should see your kneecap slide up toward your hip or see increased dimpling just above the knee. 4. For __________ seconds, keep the muscle as tight as you can without increasing your pain. 5. Relax your muscles slowly and completely. Repeat __________ times. Complete this exercise __________ times a day. Exercise E: Hamstring, isometric  1. Lie on your back on a firm surface. 2. Bend your left / right knee about __________ degrees. You can prop your knee on a pillow if needed. 3. Dig your heel down and back into the surface as if you are trying to pull your heel toward your buttocks. Tighten the muscles in the back of your thighs to "dig" as hard as you can without increasing any pain. 4. Hold this position for __________ seconds. 5. Relax your muscles slowly and completely. Repeat __________ times. Complete this exercise __________ times a day. This information is not intended to replace advice given to you by your health care provider. Make sure you discuss any questions you have with your health care provider. Document Released: 07/31/2005 Document Revised: 04/06/2016 Document Reviewed: 06/12/2015 Elsevier Interactive Patient Education  2018 [employer].

## 2017-08-23 NOTE — Progress Notes (Addendum)
Subjective:  By signing my name below, I, Essence Howell, attest that this documentation has been prepared under the direction and in the presence of Delman Cheadle, MD Electronically Signed: Ladene Artist, ED Scribe 08/23/2017 at 4:54 PM.   Patient ID: Lin Givens, male    DOB: March 26, 1951, 67 y.o.   MRN: [id number]  Chief Complaint  Patient presents with  . Knee Pain    right knee pain x 2 weeks    HPI BRAINARD HIGHFILL is a 67 y.o. male who presents to Primary Care at [facility] complaining of R inner, upper knee pain x 2 wks. Pt states that he noticed pain for 3 days but decided to play golf during the holidays which only exacerbated pain. He reports associated swelling to the knee that has minimally improved with applying ice and taking hot showers. He notes that he has been limping since onset of pain and reports "popping" in his knee with ambulating and waking in the morning. Denies warmth, redness.  Past Medical History:  Diagnosis Date  . Anxiety   . Atypical chest pain    a. 04/2015 Lexiscan MV: Ef 56%, no ischemia/infarct.  . Blood transfusion without reported diagnosis   . Chronic pain in left shoulder   . Hepatitis C infection, cured    resolved after treatment wiht Harvoni  . Hyperlipidemia   . Hypertension   . RBBB    a. 04/2015 - first noted.   Current Outpatient Medications on File Prior to Visit  Medication Sig Dispense Refill  . ALPRAZolam (XANAX) 1 MG tablet Take 1 tablet (1 mg total) by mouth at bedtime and may repeat dose one time if needed. 60 tablet 5  . amLODipine (NORVASC) 5 MG tablet Take 1 tablet (5 mg total) by mouth daily. 90 tablet 3  . aspirin EC 81 MG EC tablet Take 1 tablet (81 mg total) by mouth daily.    Marland Kitchen doxepin (SINEQUAN) 25 MG capsule Take 1 capsule (25 mg total) by mouth at bedtime. 90 capsule 3   No current facility-administered medications on file prior to visit.    Allergies  Allergen Reactions  . Penicillins Swelling and Other (See  Comments)    "leshions in my mouth"  . Sulfa Antibiotics Swelling   Past Surgical History:  Procedure Laterality Date  . CHOLECYSTECTOMY    . FRACTURE SURGERY    . HERNIA REPAIR    . LAPAROTOMY N/A 06/10/2014   Procedure: EXPLORATORY LAPAROTOMY, ventral hernia repair;  Surgeon: Donnie Mesa, MD;  Location: [place];  Service: General;  Laterality: N/A;  . SPLENECTOMY    . VENTRAL HERNIA REPAIR  06/10/2014   Family History  Problem Relation Age of Onset  . Mental illness Mother   . Alzheimer's disease Mother   . Heart disease Father   . Hypertension Father    Social History   Socioeconomic History  . Marital status: Single    Spouse name: None  . Number of children: None  . Years of education: None  . Highest education level: None  Social Needs  . Financial resource strain: None  . Food insecurity - worry: None  . Food insecurity - inability: None  . Transportation needs - medical: None  . Transportation needs - non-medical: None  Occupational History  . None  Tobacco Use  . Smoking status: Former Smoker    Last attempt to quit: 04/30/2014    Years since quitting: 3.3  . Smokeless tobacco: Never Used  Substance  and Sexual Activity  . Alcohol use: No    Alcohol/week: 0.0 oz  . Drug use: No  . Sexual activity: Yes  Other Topics Concern  . None  Social History Narrative  . None   Depression screen [facility] 2/9 08/23/2017 07/25/2017 05/04/2017 04/14/2017 10/26/2016  Decreased Interest 0 0 0 1 0  Down, Depressed, Hopeless 0 0 0 2 0  PHQ - 2 Score 0 0 0 3 0  Altered sleeping - - 0 3 -  Tired, decreased energy - - 0 3 -  Change in appetite - - 0 0 -  Feeling bad or failure about yourself  - - 0 1 -  Trouble concentrating - - 0 0 -  Moving slowly or fidgety/restless - - 0 0 -  Suicidal thoughts - - 0 0 -  PHQ-9 Score - - 0 10 -  Difficult doing work/chores - - Not difficult at all Somewhat difficult -    Review of Systems  Musculoskeletal: Positive for arthralgias and  joint swelling.  Skin: Negative for color change.      Objective:   Physical Exam  Constitutional: He is oriented to person, place, and time. He appears well-developed and well-nourished. No distress.  HENT:  Head: Normocephalic and atraumatic.  Eyes: Conjunctivae and EOM are normal.  Neck: Neck supple. No tracheal deviation present.  Cardiovascular: Normal rate.  Pulmonary/Chest: Effort normal. No respiratory distress.  Musculoskeletal: Normal range of motion.  R knee: Positive tenderness to palpation  Over medial joint line, none over lateral joint line. None over LCL. Some focal swelling over the top of the proximal MCL insertion. Active extension limited to ~30 degrees, flexion limited to 90. Neg anterior/posterior drawer. Some tenderness over tibial plateau. Positive McMurray's with medial compression. Tenderness with varus and valgus stress.  Neurological: He is alert and oriented to person, place, and time.  Skin: Skin is warm and dry.  Psychiatric: He has a normal mood and affect. His behavior is normal.  Nursing note and vitals reviewed.  BP 120/70   Pulse 79   Temp 97.6 F (36.4 C)   Resp 16   Ht 6' 3.5" (1.918 m)   Wt 241 lb 6.4 oz (109.5 kg)   SpO2 100%   BMI 29.77 kg/m     Dg Knee 1-2 Views Right  Result Date: 08/23/2017 CLINICAL DATA:  severe pain over anterior medial aspect of Rt Knee x 2 wks after overuse, twisting. antalgic gait. nki. no h/o prior pain EXAM: RIGHT KNEE - 1-2 VIEW COMPARISON:  None. FINDINGS: No fracture of the proximal tibia or distal femur. Patella is normal. SMALL JOINT EFFUSION. SOFT TISSUE SWELLING SUPEROLATERAL TO THE MEDIAL FEMORAL CONDYLE as well as lateral to the medial femoral condyle. IMPRESSION: No fracture or dislocation.  NO ARTHROPATHY. Soft tissue swelling superolateral to the medial femoral condyle. Small effusion. Electronically Signed   By: Suzy Bouchard M.D.   On: 08/23/2017 17:16   Assessment & Plan:   1. Acute pain of  right knee   2. Sprain of medial collateral ligament of right knee, initial encounter - placed in reaction web brace for Rt medial injury - start stretching exercises. Cont RICE. Start naproxyn otc 500 bid. After improving, cont wearing brace while active (golf) to prevent reinjury. If not starting to sig improve within 1-2 wks, call for ortho referral. If not resolved in 4-6 wks, RTC.     Orders Placed This Encounter  Procedures  . DG Knee 1-2 Views Right  Standing Status:   Future    Number of Occurrences:   1    Standing Expiration Date:   08/23/2018    Order Specific Question:   Reason for Exam (SYMPTOM  OR DIAGNOSIS REQUIRED)    Answer:   severe pain over anterior medial aspect x 2 wks after overuse, twisting. antalgic gait. nki. no h/o prior pain    Order Specific Question:   Preferred imaging location?    Answer:   External    Meds ordered this encounter  Medications  . HYDROcodone-acetaminophen (NORCO) 10-325 MG tablet    Sig: Take 1 tablet by mouth every 4 (four) hours as needed.    Dispense:  30 tablet    Refill:  0    I personally performed the services described in this documentation, which was scribed in my presence. The recorded information has been reviewed and considered, and addended by me as needed.   Delman Cheadle, M.D.  Primary Care at [facility] [address] [place], [place] [address] [phone] phone [phone] fax  08/26/17 2:30 PM

## 2017-09-03 ENCOUNTER — Ambulatory Visit: Payer: BLUE CROSS/BLUE SHIELD | Admitting: Family Medicine

## 2017-09-03 ENCOUNTER — Encounter: Payer: Self-pay | Admitting: Family Medicine

## 2017-09-03 VITALS — BP 120/86 | HR 74 | Temp 98.5°F | Resp 16 | Ht 73.0 in | Wt 235.4 lb

## 2017-09-03 DIAGNOSIS — S83411D Sprain of medial collateral ligament of right knee, subsequent encounter: Secondary | ICD-10-CM

## 2017-09-03 DIAGNOSIS — M25561 Pain in right knee: Secondary | ICD-10-CM | POA: Diagnosis not present

## 2017-09-03 MED ORDER — NAPROXEN 500 MG PO TABS: 500.0000 mg | ORAL_TABLET | Freq: Two times a day (BID) | ORAL | 0 refills | Status: DC

## 2017-09-03 MED ORDER — OXYCODONE-ACETAMINOPHEN 10-325 MG PO TABS: 1.0000 | ORAL_TABLET | ORAL | 0 refills | Status: DC | PRN

## 2017-09-03 NOTE — Progress Notes (Addendum)
Subjective:  This chart was scribed for Shawnee Knapp, MD by Tamsen Roers, at [place] [facility].  This patient was seen in room 3 and the patient's care was started at 12:23 PM.   Chief Complaint  Patient presents with  . Medication Refill    HYDROCODONE-ACETAM  . Knee Pain    PER PATIENT PAIN HAS NEVER STOPPED HURTING     Patient ID: Tanner Webb, male    DOB: 1950-09-28, 67 y.o.   MRN: [id number]  HPI HPI Comments: [name] is a 67 y.o. male who presents to Primary Care at [facility] with right knee pain.  Patient has had one month of knee pain along the right inner upper knee which started after he played several rounds of golf.  He hears his knee popping and has been limping for the duration.  I saw him 11 days prior with x-ray that showed soft tissue swelling and small effusion so suspected he had an MCL strain.  Placed patient in brace and advised activity modification and RICE with ortho referral if not improving. ---- Patient states that his knee is only about 10% better. He is still having popping sounds and feels like it gives away once in a while.  He denies any locking sensation. Patient lives on the bottom floor of his house and has to go up the stairs to see his children (feels that this is taking a toll on his knees as well).  Patient has been compliant with the pain medication but still has pain once it wears off. He has purchased over the counter joint medication but has not tried any ibuprofen or Aleve for relief.   Past Medical History:  Diagnosis Date  . Anxiety   . Atypical chest pain    a. 04/2015 Lexiscan MV: Ef 56%, no ischemia/infarct.  . Blood transfusion without reported diagnosis   . Chronic pain in left shoulder   . Hepatitis C infection, cured    resolved after treatment wiht Harvoni  . Hyperlipidemia   . Hypertension   . RBBB    a. 04/2015 - first noted.    Current Outpatient Medications on File Prior to Visit  Medication Sig  Dispense Refill  . amLODipine (NORVASC) 5 MG tablet Take 1 tablet (5 mg total) by mouth daily. 90 tablet 3  . aspirin EC 81 MG EC tablet Take 1 tablet (81 mg total) by mouth daily.    Marland Kitchen doxepin (SINEQUAN) 25 MG capsule Take 1 capsule (25 mg total) by mouth at bedtime. 90 capsule 3  . ALPRAZolam (XANAX) 1 MG tablet Take 1 tablet (1 mg total) by mouth at bedtime and may repeat dose one time if needed. (Patient not taking: Reported on 09/03/2017) 60 tablet 5  . HYDROcodone-acetaminophen (NORCO) 10-325 MG tablet Take 1 tablet by mouth every 4 (four) hours as needed. (Patient not taking: Reported on 09/03/2017) 30 tablet 0   No current facility-administered medications on file prior to visit.     Allergies  Allergen Reactions  . Penicillins Swelling and Other (See Comments)    "leshions in my mouth"  . Sulfa Antibiotics Swelling   Past Surgical History:  Procedure Laterality Date  . CHOLECYSTECTOMY    . FRACTURE SURGERY    . HERNIA REPAIR    . LAPAROTOMY N/A 06/10/2014   Procedure: EXPLORATORY LAPAROTOMY, ventral hernia repair;  Surgeon: Donnie Mesa, MD;  Location: [place];  Service: General;  Laterality: N/A;  . SPLENECTOMY    .  VENTRAL HERNIA REPAIR  06/10/2014   Family History  Problem Relation Age of Onset  . Mental illness Mother   . Alzheimer's disease Mother   . Heart disease Father   . Hypertension Father    Social History   Socioeconomic History  . Marital status: Single    Spouse name: None  . Number of children: None  . Years of education: None  . Highest education level: None  Social Needs  . Financial resource strain: None  . Food insecurity - worry: None  . Food insecurity - inability: None  . Transportation needs - medical: None  . Transportation needs - non-medical: None  Occupational History  . None  Tobacco Use  . Smoking status: Former Smoker    Last attempt to quit: 04/30/2014    Years since quitting: 3.3  . Smokeless tobacco: Never Used  Substance  and Sexual Activity  . Alcohol use: No    Alcohol/week: 0.0 oz  . Drug use: No  . Sexual activity: Yes  Other Topics Concern  . None  Social History Narrative  . None   Depression screen [facility] 2/9 09/03/2017 08/23/2017 07/25/2017 05/04/2017 04/14/2017  Decreased Interest 0 0 0 0 1  Down, Depressed, Hopeless 0 0 0 0 2  PHQ - 2 Score 0 0 0 0 3  Altered sleeping - - - 0 3  Tired, decreased energy - - - 0 3  Change in appetite - - - 0 0  Feeling bad or failure about yourself  - - - 0 1  Trouble concentrating - - - 0 0  Moving slowly or fidgety/restless - - - 0 0  Suicidal thoughts - - - 0 0  PHQ-9 Score - - - 0 10  Difficult doing work/chores - - - Not difficult at all Somewhat difficult      Review of Systems  Constitutional: Negative for chills and fever.  Eyes: Negative for pain and redness.  Respiratory: Negative for cough and choking.   Gastrointestinal: Negative for nausea and vomiting.  Musculoskeletal:       Knee pain       Objective:   Physical Exam  Constitutional: He is oriented to person, place, and time. He appears well-developed and well-nourished. No distress.  HENT:  Head: Normocephalic and atraumatic.  Pulmonary/Chest: No respiratory distress.  Musculoskeletal:  Right knee:  Flexion limited to 90 degrees extension limited to 30 degrees Some wasting of the quads, small to moderate suprapatellar effusion.  Tenderness over the right upper medial aspect tenderness to palpation.  Tenderness over the medial joint line.  Positive McMurray's with medial compression. Pain with valgus stress.  Neurological: He is alert and oriented to person, place, and time.  Skin: Skin is warm and dry.  Psychiatric: He has a normal mood and affect. His behavior is normal.   BP 120/86 (BP Location: Left Arm, Patient Position: Sitting, Cuff Size: Large)   Pulse 74   Temp 98.5 F (36.9 C) (Oral)   Resp 16   Ht 6\' 1"  (1.854 m)   Wt 235 lb 6.4 oz (106.8 kg)   SpO2 98%   BMI  31.06 kg/m      Assessment & Plan:   1. Acute pain of right knee   2. Medial knee pain, right   3. Sprain of medial collateral ligament of right knee, subsequent encounter   Has been injured for a month with still obvious effusion, ttp, antalgic gait despite compliance with all conservative measures and  bracing - concern for meniscal or ligamentous tear - needs MRI and Ortho referral. Pt lives in [place], near [place], so will try to get done at a location closer to him.  Orders Placed This Encounter  Procedures  . MR Knee Right Wo Contrast    Standing Status:   Future    Standing Expiration Date:   11/02/2018    Order Specific Question:   What is the patient's sedation requirement?    Answer:   No Sedation    Order Specific Question:   Does the patient have a pacemaker or implanted devices?    Answer:   No    Order Specific Question:   Preferred imaging location?    Answer:   [facility] (table limit-350lbs)    Order Specific Question:   Radiology Contrast Protocol - do NOT remove file path    Answer:   [email or web address]  . Ambulatory referral to Orthopedic Surgery    Referral Priority:   Routine    Referral Type:   Surgical    Referral Reason:   Specialty Services Required    Requested Specialty:   Orthopedic Surgery    Number of Visits Requested:   1    Meds ordered this encounter  Medications  . oxyCODONE-acetaminophen (PERCOCET) 10-325 MG tablet    Sig: Take 1 tablet by mouth every 4 (four) hours as needed for pain.    Dispense:  30 tablet    Refill:  0  . DISCONTD: naproxen (NAPROSYN) 500 MG tablet    Sig: Take 1 tablet (500 mg total) by mouth 2 (two) times daily with a meal.    Dispense:  30 tablet    Refill:  0  . naproxen (NAPROSYN) 500 MG tablet    Sig: Take 1 tablet (500 mg total) by mouth 2 (two) times daily with a meal.    Dispense:  60 tablet    Refill:  0    D/c prior rx for #30    I personally performed the services  described in this documentation, which was scribed in my presence. The recorded information has been reviewed and considered, and addended by me as needed.   Delman Cheadle, M.D.  Primary Care at [facility] [address] [place],  [address] [phone] phone [phone] fax  09/03/17 3:01 PM

## 2017-09-03 NOTE — Patient Instructions (Addendum)
IF you received an x-ray today, you will receive an invoice from [facility]. Please contact [facility] at [phone] with questions or concerns regarding your invoice.   IF you received labwork today, you will receive an invoice from [facility]. Please contact [facility] at [phone] with questions or concerns regarding your invoice.   Our billing staff will not be able to assist you with questions regarding bills from these companies.  You will be contacted with the lab results as soon as they are available. The fastest way to get your results is to activate your My Chart account. Instructions are located on the last page of this paperwork. If you have not heard from [place] regarding the results in 2 weeks, please contact this office.     Meniscus Tear A meniscus tear is a knee injury in which a piece of the meniscus is torn. The meniscus is a thick, rubbery, wedge-shaped cartilage in the knee. Two menisci are located in each knee. They sit between the upper bone (femur) and lower bone (tibia) that make up the knee joint. Each meniscus acts as a shock absorber for the knee. A torn meniscus is one of the most common types of knee injuries. This injury can range from mild to severe. Surgery may be needed for a severe tear. What are the causes? This injury may be caused by any squatting, twisting, or pivoting movement. Sports-related injuries are the most common cause. These often occur from:  Running and stopping suddenly.  Changing direction.  Being tackled or knocked off your feet.  As people get older, their meniscus gets thinner and weaker. In these people, tears can happen more easily, such as from climbing stairs. What increases the risk? This injury is more likely to happen to:  People who play contact sports.  Males.  People who are 65?65 years of age.  What are the signs or symptoms? Symptoms of this injury include:  Knee pain, especially at the  side of the knee joint. You may feel pain when the injury occurs, or you may only hear a pop and feel pain later.  A feeling that your knee is clicking, catching, locking, or giving way.  Not being able to fully bend or extend your knee.  Bruising or swelling in your knee.  How is this diagnosed? This injury may be diagnosed based on your symptoms and a physical exam. The physical exam may include:  Moving your knee in different ways.  Feeling for tenderness.  Listening for a clicking sound.  Checking if your knee locks or catches.  You may also have tests, such as:  X-rays.  MRI.  A procedure to look inside your knee with a narrow surgical telescope (arthroscopy).  You may be referred to a knee specialist (orthopedic surgeon). How is this treated? Treatment for this injury depends on the severity of the tear. Treatment for a mild tear may include:  Rest.  Medicine to reduce pain and swelling. This is usually a nonsteroidal anti-inflammatory drug (NSAID).  A knee brace or an elastic sleeve or wrap.  Using crutches or a walker to keep weight off your knee and to help you walk.  Exercises to strengthen your knee (physical therapy).  You may need surgery if you have a severe tear or if other treatments are not working. Follow these instructions at home: Managing pain and swelling  Take over-the-counter and prescription medicines only as told by your health care provider.  If directed, apply  ice to the injured area: ? Put ice in a plastic bag. ? Place a towel between your skin and the bag. ? Leave the ice on for 20 minutes, 2-3 times per day.  Raise (elevate) the injured area above the level of your heart while you are sitting or lying down. Activity  Do not use the injured limb to support your body weight until your health care provider says that you can. Use crutches or a walker as told by your health care provider.  Return to your normal activities as told by  your health care provider. Ask your health care provider what activities are safe for you.  Perform range-of-motion exercises only as told by your health care provider.  Begin doing exercises to strengthen your knee and leg muscles only as told by your health care provider. After you recover, your health care provider may recommend these exercises to help prevent another injury. General instructions  Use a knee brace or elastic wrap as told by your health care provider.  Keep all follow-up visits as told by your health care provider. This is important. Contact a health care provider if:  You have a fever.  Your knee becomes red, tender, or swollen.  Your pain medicine is not helping.  Your symptoms get worse or do not improve after 2 weeks of home care. This information is not intended to replace advice given to you by your health care provider. Make sure you discuss any questions you have with your health care provider. Document Released: 10/21/2002 Document Revised: 01/06/2016 Document Reviewed: 11/23/2014 Elsevier Interactive Patient Education  [employer].

## 2017-09-12 ENCOUNTER — Telehealth: Payer: Self-pay | Admitting: Family Medicine

## 2017-09-12 NOTE — Telephone Encounter (Signed)
Called [facility] # given. Peer-to-peer completed.  MRI approved 09/12/17-10/11/17 Order # [id number].

## 2017-09-12 NOTE — Telephone Encounter (Signed)
Tried to get pt MR KNEE RIGHT WO CONTRAST authorized through insurance but insurance denied procedure they are requesting a peer to peer be done to see if it can get approved.Marland Kitchen [facility]IM: [phone]

## 2017-09-12 NOTE — Telephone Encounter (Signed)
Faxed to [facility] 1/30

## 2017-09-20 ENCOUNTER — Ambulatory Visit (INDEPENDENT_AMBULATORY_CARE_PROVIDER_SITE_OTHER): Payer: BLUE CROSS/BLUE SHIELD | Admitting: Orthopaedic Surgery

## 2017-10-15 ENCOUNTER — Other Ambulatory Visit: Payer: Self-pay | Admitting: Family Medicine

## 2018-01-01 ENCOUNTER — Other Ambulatory Visit: Payer: Self-pay | Admitting: Family Medicine

## 2018-01-02 NOTE — Telephone Encounter (Signed)
Sinequan refill Last OV:07/25/17 Last refill:07/25/17 90 day/1 refill WIO:[id number] Pharmacy: [id number] - [place], [place] [phone] ([phone] (Fax)

## 2018-02-21 ENCOUNTER — Ambulatory Visit: Payer: BLUE CROSS/BLUE SHIELD | Admitting: Family Medicine

## 2018-02-26 ENCOUNTER — Encounter: Payer: Self-pay | Admitting: Family Medicine

## 2018-02-26 ENCOUNTER — Ambulatory Visit: Payer: BLUE CROSS/BLUE SHIELD | Admitting: Family Medicine

## 2018-02-26 VITALS — BP 124/78 | HR 71 | Temp 97.8°F | Resp 17 | Ht 73.0 in | Wt 197.0 lb

## 2018-02-26 DIAGNOSIS — I8393 Asymptomatic varicose veins of bilateral lower extremities: Secondary | ICD-10-CM | POA: Diagnosis not present

## 2018-02-26 NOTE — Progress Notes (Signed)
Subjective:  By signing my name below, I, Joslyn Devon, attest that this documentation has been prepared under the direction and in the presence of Merri Ray.  Electronically Signed: Joslyn Devon, ED Scribe 02/26/2018 at 10:12 AM.    Patient ID: Tanner Webb, male    DOB: [birth year], 67 y.o.   MRN: [id number]  Chief Complaint  Patient presents with  . knot in leg     HPI Tanner Webb is a 67 y.o. male who presents to Primary Care at [facility] complaining of knot in his leg.  He notes he has been exercising more with biking and noticed 1 week ago a prominent vein in his right medial calf with a knot present. He denies pain or discomfort in the right calf. He denies history of DVT. He notes he has been using self tanner.      Patient Active Problem List   Diagnosis Date Noted  . Insomnia 08/03/2017  . Anxiety 12/19/2015  . Essential hypertension 05/08/2015  . Hyperlipidemia 05/08/2015  . Atypical chest pain 05/07/2015  . Incarcerated ventral hernia 06/10/2014  . Chronic pain in left shoulder 03/30/2012  . Anemia 03/30/2012   Past Medical History:  Diagnosis Date  . Anxiety   . Atypical chest pain    a. 04/2015 Lexiscan MV: Ef 56%, no ischemia/infarct.  . Blood transfusion without reported diagnosis   . Chronic pain in left shoulder   . Hepatitis C infection, cured    resolved after treatment wiht Harvoni  . Hyperlipidemia   . Hypertension   . RBBB    a. 04/2015 - first noted.   Past Surgical History:  Procedure Laterality Date  . CHOLECYSTECTOMY    . FRACTURE SURGERY    . HERNIA REPAIR    . LAPAROTOMY N/A 06/10/2014   Procedure: EXPLORATORY LAPAROTOMY, ventral hernia repair;  Surgeon: Donnie Mesa, MD;  Location: [facility];  Service: General;  Laterality: N/A;  . SPLENECTOMY    . VENTRAL HERNIA REPAIR  06/10/2014   Allergies  Allergen Reactions  . Penicillins Swelling and Other (See Comments)    "leshions in my mouth"  . Sulfa Antibiotics Swelling    Prior to Admission medications   Medication Sig Start Date End Date Taking? Authorizing Provider  ALPRAZolam Duanne Moron) 1 MG tablet TAKE 1 TABLET BY MOUTH AT BEDTIME AND MAY REPEAT DOSE ONE time if needed 10/16/17  Yes Shawnee Knapp, MD  amLODipine (NORVASC) 5 MG tablet Take 1 tablet (5 mg total) by mouth daily. 04/14/17  Yes Shawnee Knapp, MD  aspirin EC 81 MG EC tablet Take 1 tablet (81 mg total) by mouth daily. 05/08/15  Yes Theora Gianotti, NP  Co-Enzyme Q-10 100 MG CAPS Take by mouth.   Yes [provider]  doxepin (SINEQUAN) 25 MG capsule Take 1 capsule (25 mg total) by mouth at bedtime. 07/25/17  Yes Shawnee Knapp, MD  magnesium 30 MG tablet Take 30 mg by mouth 2 (two) times daily.   Yes [provider]  Multiple Vitamins-Minerals (MULTIVITAMIN WITH MINERALS) tablet Take 1 tablet by mouth daily.   Yes [provider]   Social History   Socioeconomic History  . Marital status: Single    Spouse name: Not on file  . Number of children: Not on file  . Years of education: Not on file  . Highest education level: Not on file  Occupational History  . Not on file  Social Needs  . Financial resource strain: Not  on file  . Food insecurity:    Worry: Not on file    Inability: Not on file  . Transportation needs:    Medical: Not on file    Non-medical: Not on file  Tobacco Use  . Smoking status: Former Smoker    Last attempt to quit: 04/30/2014    Years since quitting: 3.8  . Smokeless tobacco: Never Used  Substance and Sexual Activity  . Alcohol use: No    Alcohol/week: 0.0 oz  . Drug use: No  . Sexual activity: Yes  Lifestyle  . Physical activity:    Days per week: Not on file    Minutes per session: Not on file  . Stress: Not on file  Relationships  . Social connections:    Talks on phone: Not on file    Gets together: Not on file    Attends religious service: Not on file    Active member of club or organization: Not on file    Attends meetings  of clubs or organizations: Not on file    Relationship status: Not on file  . Intimate partner violence:    Fear of current or ex partner: Not on file    Emotionally abused: Not on file    Physically abused: Not on file    Forced sexual activity: Not on file  Other Topics Concern  . Not on file  Social History Narrative  . Not on file      Review of Systems  Constitutional: Negative.   HENT: Negative.   Eyes: Negative.   Respiratory: Negative.   Cardiovascular: Negative.        Knot in right medial calf.  Gastrointestinal: Negative.   Endocrine: Negative.   Genitourinary: Negative.   Musculoskeletal:       No calf pain b/l  Skin: Negative.        Skin tan from self tanner  Allergic/Immunologic: Negative.   Neurological: Negative.   Hematological: Negative.   Psychiatric/Behavioral: Negative.        Objective:   Physical Exam  Constitutional: He is oriented to person, place, and time. He appears well-developed and well-nourished. No distress.  HENT:  Head: Normocephalic and atraumatic.  Cardiovascular: Normal rate.  Prominent vein on right medial calf.  Vein flattens with pressure. No overlying skin erythema. Left leg with varicosity and lower on right leg with varicosity.   Pulmonary/Chest: Effort normal.  Musculoskeletal:  No calf tenderness or swelling.  Neurological: He is alert and oriented to person, place, and time.  Psychiatric: He has a normal mood and affect.      Vitals:   02/26/18 0957  BP: 124/78  Pulse: 71  Resp: 17  Temp: 97.8 F (36.6 C)  TempSrc: Oral  SpO2: 98%  Weight: 197 lb (89.4 kg)  Height: 6\' 1"  (1.854 m)       Assessment & Plan:   Tanner Webb is a 67 y.o. male Asymptomatic varicose veins of both lower extremities  -Area of concern appears to be small varicosity without any sign of infection/erythema, and does not appear thrombosed.  RTC precautions and complications of varicose veins discussed, offered meeting with  vein specialist to review options for varicose veins, but declined at this time.  No orders of the defined types were placed in this encounter.  Patient Instructions   The area on your right leg appears to be a varicose vein.  I do not see any complications at this time, but if you do  notice any bleeding from the area, or hard/persistently swollen area, be seen right away as those can be complications of varicose veins.  At any point if you would like those treated, let us know and we are happy to refer you to a vein specialist.  Return to the clinic or go to the nearest emergency room if any of your symptoms worsen or new symptoms occur.   Varicose Veins Varicose veins are veins that have become enlarged and twisted. They are usually seen in the legs but can occur in other parts of the body as well. What are the causes? This condition is the result of valves in the veins not working properly. Valves in the veins help to return blood from the leg to the heart. If these valves are damaged, blood flows backward and backs up into the veins in the leg near the skin. This causes the veins to become larger. What increases the risk? People who are on their feet a lot, who are pregnant, or who are overweight are more likely to develop varicose veins. What are the signs or symptoms?  Bulging, twisted-appearing, bluish veins, most commonly found on the legs.  Leg pain or a feeling of heaviness. These symptoms may be worse at the end of the day.  Leg swelling.  Changes in skin color. How is this diagnosed? A health care provider can usually diagnose varicose veins by examining your legs. Your health care provider may also recommend an ultrasound of your leg veins. How is this treated? Most varicose veins can be treated at home.However, other treatments are available for people who have persistent symptoms or want to improve the cosmetic appearance of the varicose veins. These treatment options  include:  Sclerotherapy. A solution is injected into the vein to close it off.  Laser treatment. A laser is used to heat the vein to close it off.  Radiofrequency vein ablation. An electrical current produced by radio waves is used to close off the vein.  Phlebectomy. The vein is surgically removed through small incisions made over the varicose vein.  Vein ligation and stripping. The vein is surgically removed through incisions made over the varicose vein after the vein has been tied (ligated).  Follow these instructions at home:  Do not stand or sit in one position for long periods of time. Do not sit with your legs crossed. Rest with your legs raised during the day.  Wear compression stockings as directed by your health care provider. These stockings help to prevent blood clots and reduce swelling in your legs.  Do not wear other tight, encircling garments around your legs, pelvis, or waist.  Walk as much as possible to increase blood flow.  Raise the foot of your bed at night with 2-inch blocks.  If you get a cut in the skin over the vein and the vein bleeds, lie down with your leg raised and press on it with a clean cloth until the bleeding stops. Then place a bandage (dressing) on the cut. See your health care provider if it continues to bleed. Contact a health care provider if:  The skin around your ankle starts to break down.  You have pain, redness, tenderness, or hard swelling in your leg over a vein.  You are uncomfortable because of leg pain. This information is not intended to replace advice given to you by your health care provider. Make sure you discuss any questions you have with your health care provider. Document Released: 05/10/2005 Document  Revised: 01/06/2016 Document Reviewed: 02/01/2016 Elsevier Interactive Patient Education  2017 [employer].     IF you received an x-ray today, you will receive an invoice from [facility]. Please contact  [facility] at [phone] with questions or concerns regarding your invoice.   IF you received labwork today, you will receive an invoice from [facility]. Please contact [facility] at [phone] with questions or concerns regarding your invoice.   Our billing staff will not be able to assist you with questions regarding bills from these companies.  You will be contacted with the lab results as soon as they are available. The fastest way to get your results is to activate your My Chart account. Instructions are located on the last page of this paperwork. If you have not heard from [place] regarding the results in 2 weeks, please contact this office.       I personally performed the services described in this documentation, which was scribed in my presence. The recorded information has been reviewed and considered for accuracy and completeness, addended by me as needed, and agree with information above.  Signed,   Merri Ray, MD [facility] at [facility].  03/01/18 1:16 PM

## 2018-02-26 NOTE — Patient Instructions (Addendum)
The area on your right leg appears to be a varicose vein.  I do not see any complications at this time, but if you do notice any bleeding from the area, or hard/persistently swollen area, be seen right away as those can be complications of varicose veins.  At any point if you would like those treated, let us know and we are happy to refer you to a vein specialist.  Return to the clinic or go to the nearest emergency room if any of your symptoms worsen or new symptoms occur.   Varicose Veins Varicose veins are veins that have become enlarged and twisted. They are usually seen in the legs but can occur in other parts of the body as well. What are the causes? This condition is the result of valves in the veins not working properly. Valves in the veins help to return blood from the leg to the heart. If these valves are damaged, blood flows backward and backs up into the veins in the leg near the skin. This causes the veins to become larger. What increases the risk? People who are on their feet a lot, who are pregnant, or who are overweight are more likely to develop varicose veins. What are the signs or symptoms?  Bulging, twisted-appearing, bluish veins, most commonly found on the legs.  Leg pain or a feeling of heaviness. These symptoms may be worse at the end of the day.  Leg swelling.  Changes in skin color. How is this diagnosed? A health care provider can usually diagnose varicose veins by examining your legs. Your health care provider may also recommend an ultrasound of your leg veins. How is this treated? Most varicose veins can be treated at home.However, other treatments are available for people who have persistent symptoms or want to improve the cosmetic appearance of the varicose veins. These treatment options include:  Sclerotherapy. A solution is injected into the vein to close it off.  Laser treatment. A laser is used to heat the vein to close it off.  Radiofrequency vein  ablation. An electrical current produced by radio waves is used to close off the vein.  Phlebectomy. The vein is surgically removed through small incisions made over the varicose vein.  Vein ligation and stripping. The vein is surgically removed through incisions made over the varicose vein after the vein has been tied (ligated).  Follow these instructions at home:  Do not stand or sit in one position for long periods of time. Do not sit with your legs crossed. Rest with your legs raised during the day.  Wear compression stockings as directed by your health care provider. These stockings help to prevent blood clots and reduce swelling in your legs.  Do not wear other tight, encircling garments around your legs, pelvis, or waist.  Walk as much as possible to increase blood flow.  Raise the foot of your bed at night with 2-inch blocks.  If you get a cut in the skin over the vein and the vein bleeds, lie down with your leg raised and press on it with a clean cloth until the bleeding stops. Then place a bandage (dressing) on the cut. See your health care provider if it continues to bleed. Contact a health care provider if:  The skin around your ankle starts to break down.  You have pain, redness, tenderness, or hard swelling in your leg over a vein.  You are uncomfortable because of leg pain. This information is not intended to replace  advice given to you by your health care provider. Make sure you discuss any questions you have with your health care provider. Document Released: 05/10/2005 Document Revised: 01/06/2016 Document Reviewed: 02/01/2016 Elsevier Interactive Patient Education  2017 [employer].     IF you received an x-ray today, you will receive an invoice from [facility]. Please contact [facility] at [phone] with questions or concerns regarding your invoice.   IF you received labwork today, you will receive an invoice from [place]. Please contact  [facility] at [phone] with questions or concerns regarding your invoice.   Our billing staff will not be able to assist you with questions regarding bills from these companies.  You will be contacted with the lab results as soon as they are available. The fastest way to get your results is to activate your My Chart account. Instructions are located on the last page of this paperwork. If you have not heard from [place] regarding the results in 2 weeks, please contact this office.

## 2018-04-01 ENCOUNTER — Ambulatory Visit: Payer: BLUE CROSS/BLUE SHIELD | Admitting: Family Medicine

## 2018-04-04 ENCOUNTER — Other Ambulatory Visit: Payer: Self-pay | Admitting: Family Medicine

## 2018-04-04 NOTE — Telephone Encounter (Signed)
Refill of Xanax  LOV 02/26/18 Dr. Carlota Raspberry  [facility] 10/16/17  #60  5 refills PCP Dr. [facility] - [place], [place], [place]

## 2018-04-05 NOTE — Telephone Encounter (Signed)
Patient is requesting a refill of the following medications: Requested Prescriptions   Pending Prescriptions Disp Refills  . ALPRAZolam (XANAX) 1 MG tablet [Pharmacy Med Name: ALPRAZOLAM 1 MG TABLET] 60 tablet     Sig: TAKE ONE TABLET BY MOUTH AT BEDTIME AND MAY REPEAT DOSE ONCE IF NEEDED    Date of patient request: 04/04/2018 Last office visit: 02/26/2018 Date of last refill: 10/16/2017 Last refill amount: 60  Follow up time period per chart:

## 2018-04-08 NOTE — Telephone Encounter (Signed)
Pt has appt sched 9/16 - will refill x 1 mo to get him to appt.

## 2018-04-18 ENCOUNTER — Telehealth: Payer: Self-pay | Admitting: Family Medicine

## 2018-04-18 NOTE — Telephone Encounter (Signed)
Called pt to try and reschedule appt with Dr. Brigitte Pulse. Left VM for pt to call the office and reschedule an appt. When pt calls back, please reschedule at their convenience for:   [place]  Thank you!

## 2018-04-29 ENCOUNTER — Ambulatory Visit (INDEPENDENT_AMBULATORY_CARE_PROVIDER_SITE_OTHER): Payer: BLUE CROSS/BLUE SHIELD | Admitting: Emergency Medicine

## 2018-04-29 ENCOUNTER — Encounter: Payer: Self-pay | Admitting: Emergency Medicine

## 2018-04-29 ENCOUNTER — Ambulatory Visit: Payer: BLUE CROSS/BLUE SHIELD | Admitting: Family Medicine

## 2018-04-29 ENCOUNTER — Other Ambulatory Visit: Payer: Self-pay

## 2018-04-29 VITALS — BP 111/71 | HR 60 | Temp 97.7°F | Resp 16 | Ht 75.5 in | Wt 199.2 lb

## 2018-04-29 DIAGNOSIS — Z23 Encounter for immunization: Secondary | ICD-10-CM | POA: Diagnosis not present

## 2018-04-29 DIAGNOSIS — I10 Essential (primary) hypertension: Secondary | ICD-10-CM

## 2018-04-29 DIAGNOSIS — G4700 Insomnia, unspecified: Secondary | ICD-10-CM | POA: Diagnosis not present

## 2018-04-29 MED ORDER — DOXEPIN HCL 25 MG PO CAPS
25.0000 mg | ORAL_CAPSULE | Freq: Every day | ORAL | 0 refills | Status: DC
Start: 2018-04-29 — End: 2018-07-02

## 2018-04-29 MED ORDER — ALPRAZOLAM 1 MG PO TABS: 1.0000 mg | ORAL_TABLET | Freq: Every evening | ORAL | 0 refills | Status: DC | PRN

## 2018-04-29 MED ORDER — AMLODIPINE BESYLATE 5 MG PO TABS: 5.0000 mg | ORAL_TABLET | Freq: Every day | ORAL | 3 refills | Status: DC

## 2018-04-29 NOTE — Progress Notes (Signed)
Tanner Webb 67 y.o.   Chief Complaint  Patient presents with  . Medication Refill    XANAX, Doxepin and amlodipine    HISTORY OF PRESENT ILLNESS: This is a 67 y.o. male Dr. Raul Del patient here for follow-up and medication refill.  Has a history of hypertension and insomnia.  No complaints today. BP Readings from Last 3 Encounters:  04/29/18 111/71  02/26/18 124/78  09/03/17 120/86   Wt Readings from Last 3 Encounters:  04/29/18 199 lb 3.2 oz (90.4 kg)  02/26/18 197 lb (89.4 kg)  09/03/17 235 lb 6.4 oz (106.8 kg)    HPI   Prior to Admission medications   Medication Sig Start Date End Date Taking? Authorizing Provider  ALPRAZolam Duanne Moron) 1 MG tablet Take 1-2 tablets (1-2 mg total) by mouth at bedtime as needed for anxiety. 04/08/18  Yes Shawnee Knapp, MD  amLODipine (NORVASC) 5 MG tablet Take 1 tablet (5 mg total) by mouth daily. 04/14/17  Yes Shawnee Knapp, MD  aspirin EC 81 MG EC tablet Take 1 tablet (81 mg total) by mouth daily. 05/08/15  Yes Theora Gianotti, NP  Co-Enzyme Q-10 100 MG CAPS Take by mouth.   Yes [provider]  doxepin (SINEQUAN) 25 MG capsule Take 1 capsule (25 mg total) by mouth at bedtime. 07/25/17  Yes Shawnee Knapp, MD  magnesium 30 MG tablet Take 30 mg by mouth 2 (two) times daily.   Yes [provider]  Multiple Vitamins-Minerals (MULTIVITAMIN WITH MINERALS) tablet Take 1 tablet by mouth daily.   Yes [provider]    Allergies  Allergen Reactions  . Penicillins Swelling and Other (See Comments)    "leshions in my mouth"  . Sulfa Antibiotics Swelling    Patient Active Problem List   Diagnosis Date Noted  . Insomnia 08/03/2017  . Anxiety 12/19/2015  . Essential hypertension 05/08/2015  . Hyperlipidemia 05/08/2015  . Atypical chest pain 05/07/2015  . Incarcerated ventral hernia 06/10/2014  . Chronic pain in left shoulder 03/30/2012  . Anemia 03/30/2012    Past Medical History:  Diagnosis Date  . Anxiety     . Atypical chest pain    a. 04/2015 Lexiscan MV: Ef 56%, no ischemia/infarct.  . Blood transfusion without reported diagnosis   . Chronic pain in left shoulder   . Hepatitis C infection, cured    resolved after treatment wiht Harvoni  . Hyperlipidemia   . Hypertension   . RBBB    a. 04/2015 - first noted.    Past Surgical History:  Procedure Laterality Date  . CHOLECYSTECTOMY    . FRACTURE SURGERY    . HERNIA REPAIR    . LAPAROTOMY N/A 06/10/2014   Procedure: EXPLORATORY LAPAROTOMY, ventral hernia repair;  Surgeon: Donnie Mesa, MD;  Location: [facility];  Service: General;  Laterality: N/A;  . SPLENECTOMY    . VENTRAL HERNIA REPAIR  06/10/2014    Social History   Socioeconomic History  . Marital status: Single    Spouse name: Not on file  . Number of children: Not on file  . Years of education: Not on file  . Highest education level: Not on file  Occupational History  . Not on file  Social Needs  . Financial resource strain: Not on file  . Food insecurity:    Worry: Not on file    Inability: Not on file  . Transportation needs:    Medical: Not on file    Non-medical: Not on  file  Tobacco Use  . Smoking status: Former Smoker    Last attempt to quit: 04/30/2014    Years since quitting: 4.0  . Smokeless tobacco: Never Used  Substance and Sexual Activity  . Alcohol use: No    Alcohol/week: 0.0 standard drinks  . Drug use: No  . Sexual activity: Yes  Lifestyle  . Physical activity:    Days per week: Not on file    Minutes per session: Not on file  . Stress: Not on file  Relationships  . Social connections:    Talks on phone: Not on file    Gets together: Not on file    Attends religious service: Not on file    Active member of club or organization: Not on file    Attends meetings of clubs or organizations: Not on file    Relationship status: Not on file  . Intimate partner violence:    Fear of current or ex partner: Not on file    Emotionally abused: Not on  file    Physically abused: Not on file    Forced sexual activity: Not on file  Other Topics Concern  . Not on file  Social History Narrative  . Not on file    Family History  Problem Relation Age of Onset  . Mental illness Mother   . Alzheimer's disease Mother   . Heart disease Father   . Hypertension Father      Review of Systems  Constitutional: Positive for weight loss (Intentional). Negative for chills and fever.  HENT: Negative.  Negative for sore throat.   Eyes: Negative.  Negative for blurred vision.  Respiratory: Negative.  Negative for cough, shortness of breath and wheezing.   Cardiovascular: Negative.  Negative for chest pain and palpitations.  Gastrointestinal: Negative.  Negative for abdominal pain, diarrhea, nausea and vomiting.  Genitourinary: Negative.  Negative for dysuria and hematuria.  Musculoskeletal: Negative.  Negative for back pain, joint pain, myalgias and neck pain.  Skin: Negative for rash.  Neurological: Negative.  Negative for dizziness and headaches.  Endo/Heme/Allergies: Negative.   Psychiatric/Behavioral: The patient has insomnia (Chronic).   All other systems reviewed and are negative.   Vitals:   04/29/18 1049  BP: 111/71  Pulse: 60  Resp: 16  Temp: 97.7 F (36.5 C)  SpO2: 98%    Physical Exam  Constitutional: He is oriented to person, place, and time. He appears well-developed and well-nourished.  HENT:  Head: Normocephalic and atraumatic.  Mouth/Throat: Oropharynx is clear and moist.  Eyes: Pupils are equal, round, and reactive to light. Conjunctivae and EOM are normal.  Neck: Normal range of motion. Neck supple. No JVD present.  Cardiovascular: Normal rate, regular rhythm and normal heart sounds.  Pulmonary/Chest: Effort normal and breath sounds normal.  Abdominal: Soft. Bowel sounds are normal. He exhibits no distension. There is no tenderness.  Musculoskeletal: Normal range of motion. He exhibits no edema.  Neurological: He  is alert and oriented to person, place, and time. No sensory deficit. He exhibits normal muscle tone.  Skin: Skin is warm and dry. Capillary refill takes less than 2 seconds. No rash noted.  Psychiatric: He has a normal mood and affect. His behavior is normal.  Vitals reviewed.  A total of 25 minutes was spent in the room with the patient, greater than 50% of which was in counseling/coordination of care regarding chronic medical problems, medications, and need for follow-up.   ASSESSMENT & PLAN: Riggs was seen today for  medication refill.  Diagnoses and all orders for this visit:  Essential hypertension -     amLODipine (NORVASC) 5 MG tablet; Take 1 tablet (5 mg total) by mouth daily.  Need for prophylactic vaccination and inoculation against influenza -     Flu Vaccine QUAD 36+ mos IM  Insomnia, unspecified type -     doxepin (SINEQUAN) 25 MG capsule; Take 1 capsule (25 mg total) by mouth at bedtime.  Other orders -     ALPRAZolam (XANAX) 1 MG tablet; Take 1 tablet (1 mg total) by mouth at bedtime as needed for anxiety.    Patient Instructions       If you have lab work done today you will be contacted with your lab results within the next 2 weeks.  If you have not heard from [place] then please contact us. The fastest way to get your results is to register for My Chart.   IF you received an x-ray today, you will receive an invoice from [facility]. Please contact [facility] at [phone] with questions or concerns regarding your invoice.   IF you received labwork today, you will receive an invoice from [facility]. Please contact [facility] at [phone] with questions or concerns regarding your invoice.   Our billing staff will not be able to assist you with questions regarding bills from these companies.  You will be contacted with the lab results as soon as they are available. The fastest way to get your results is to activate your My Chart account.  Instructions are located on the last page of this paperwork. If you have not heard from [place] regarding the results in 2 weeks, please contact this office.    Insomnia Insomnia is a sleep disorder that makes it difficult to fall asleep or to stay asleep. Insomnia can cause tiredness (fatigue), low energy, difficulty concentrating, mood swings, and poor performance at work or school. There are three different ways to classify insomnia:  Difficulty falling asleep.  Difficulty staying asleep.  Waking up too early in the morning.  Any type of insomnia can be long-term (chronic) or short-term (acute). Both are common. Short-term insomnia usually lasts for three months or less. Chronic insomnia occurs at least three times a week for longer than three months. What are the causes? Insomnia may be caused by another condition, situation, or substance, such as:  Anxiety.  Certain medicines.  Gastroesophageal reflux disease (GERD) or other gastrointestinal conditions.  Asthma or other breathing conditions.  Restless legs syndrome, sleep apnea, or other sleep disorders.  Chronic pain.  Menopause. This may include hot flashes.  Stroke.  Abuse of alcohol, tobacco, or illegal drugs.  Depression.  Caffeine.  Neurological disorders, such as Alzheimer disease.  An overactive thyroid (hyperthyroidism).  The cause of insomnia may not be known. What increases the risk? Risk factors for insomnia include:  Gender. Women are more commonly affected than men.  Age. Insomnia is more common as you get older.  Stress. This may involve your professional or personal life.  Income. Insomnia is more common in people with lower income.  Lack of exercise.  Irregular work schedule or night shifts.  Traveling between different time zones.  What are the signs or symptoms? If you have insomnia, trouble falling asleep or trouble staying asleep is the main symptom. This may lead to other symptoms,  such as:  Feeling fatigued.  Feeling nervous about going to sleep.  Not feeling rested in the morning.  Having trouble concentrating.  Feeling irritable, anxious, or depressed.  How is this treated? Treatment for insomnia depends on the cause. If your insomnia is caused by an underlying condition, treatment will focus on addressing the condition. Treatment may also include:  Medicines to help you sleep.  Counseling or therapy.  Lifestyle adjustments.  Follow these instructions at home:  Take medicines only as directed by your health care provider.  Keep regular sleeping and waking hours. Avoid naps.  Keep a sleep diary to help you and your health care provider figure out what could be causing your insomnia. Include: ? When you sleep. ? When you wake up during the night. ? How well you sleep. ? How rested you feel the next day. ? Any side effects of medicines you are taking. ? What you eat and drink.  Make your bedroom a comfortable place where it is easy to fall asleep: ? Put up shades or special blackout curtains to block light from outside. ? Use a white noise machine to block noise. ? Keep the temperature cool.  Exercise regularly as directed by your health care provider. Avoid exercising right before bedtime.  Use relaxation techniques to manage stress. Ask your health care provider to suggest some techniques that may work well for you. These may include: ? Breathing exercises. ? Routines to release muscle tension. ? Visualizing peaceful scenes.  Cut back on alcohol, caffeinated beverages, and cigarettes, especially close to bedtime. These can disrupt your sleep.  Do not overeat or eat spicy foods right before bedtime. This can lead to digestive discomfort that can make it hard for you to sleep.  Limit screen use before bedtime. This includes: ? Watching TV. ? Using your smartphone, tablet, and computer.  Stick to a routine. This can help you fall asleep  faster. Try to do a quiet activity, brush your teeth, and go to bed at the same time each night.  Get out of bed if you are still awake after 15 minutes of trying to sleep. Keep the lights down, but try reading or doing a quiet activity. When you feel sleepy, go back to bed.  Make sure that you drive carefully. Avoid driving if you feel very sleepy.  Keep all follow-up appointments as directed by your health care provider. This is important. Contact a health care provider if:  You are tired throughout the day or have trouble in your daily routine due to sleepiness.  You continue to have sleep problems or your sleep problems get worse. Get help right away if:  You have serious thoughts about hurting yourself or someone else. This information is not intended to replace advice given to you by your health care provider. Make sure you discuss any questions you have with your health care provider. Document Released: 07/28/2000 Document Revised: 12/31/2015 Document Reviewed: 05/01/2014 Elsevier Interactive Patient Education  2018 [employer].  Hypertension Hypertension, commonly called high blood pressure, is when the force of blood pumping through the arteries is too strong. The arteries are the blood vessels that carry blood from the heart throughout the body. Hypertension forces the heart to work harder to pump blood and may cause arteries to become narrow or stiff. Having untreated or uncontrolled hypertension can cause heart attacks, strokes, kidney disease, and other problems. A blood pressure reading consists of a higher number over a lower number. Ideally, your blood pressure should be below 120/80. The first ("top") number is called the systolic pressure. It is a measure of the pressure in your arteries  as your heart beats. The second ("bottom") number is called the diastolic pressure. It is a measure of the pressure in your arteries as the heart relaxes. What are the causes? The cause of  this condition is not known. What increases the risk? Some risk factors for high blood pressure are under your control. Others are not. Factors you can change  Smoking.  Having type 2 diabetes mellitus, high cholesterol, or both.  Not getting enough exercise or physical activity.  Being overweight.  Having too much fat, sugar, calories, or salt (sodium) in your diet.  Drinking too much alcohol. Factors that are difficult or impossible to change  Having chronic kidney disease.  Having a family history of high blood pressure.  Age. Risk increases with age.  Race. You may be at higher risk if you are African-American.  Gender. Men are at higher risk than women before age 83. After age 56, women are at higher risk than men.  Having obstructive sleep apnea.  Stress. What are the signs or symptoms? Extremely high blood pressure (hypertensive crisis) may cause:  Headache.  Anxiety.  Shortness of breath.  Nosebleed.  Nausea and vomiting.  Severe chest pain.  Jerky movements you cannot control (seizures).  How is this diagnosed? This condition is diagnosed by measuring your blood pressure while you are seated, with your arm resting on a surface. The cuff of the blood pressure monitor will be placed directly against the skin of your upper arm at the level of your heart. It should be measured at least twice using the same arm. Certain conditions can cause a difference in blood pressure between your right and left arms. Certain factors can cause blood pressure readings to be lower or higher than normal (elevated) for a short period of time:  When your blood pressure is higher when you are in a health care provider's office than when you are at home, this is called white coat hypertension. Most people with this condition do not need medicines.  When your blood pressure is higher at home than when you are in a health care provider's office, this is called masked hypertension.  Most people with this condition may need medicines to control blood pressure.  If you have a high blood pressure reading during one visit or you have normal blood pressure with other risk factors:  You may be asked to return on a different day to have your blood pressure checked again.  You may be asked to monitor your blood pressure at home for 1 week or longer.  If you are diagnosed with hypertension, you may have other blood or imaging tests to help your health care provider understand your overall risk for other conditions. How is this treated? This condition is treated by making healthy lifestyle changes, such as eating healthy foods, exercising more, and reducing your alcohol intake. Your health care provider may prescribe medicine if lifestyle changes are not enough to get your blood pressure under control, and if:  Your systolic blood pressure is above 130.  Your diastolic blood pressure is above 80.  Your personal target blood pressure may vary depending on your medical conditions, your age, and other factors. Follow these instructions at home: Eating and drinking  Eat a diet that is high in fiber and potassium, and low in sodium, added sugar, and fat. An example eating plan is called the DASH (Dietary Approaches to Stop Hypertension) diet. To eat this way: ? Eat plenty of fresh fruits and vegetables.  Try to fill half of your plate at each meal with fruits and vegetables. ? Eat whole grains, such as whole wheat pasta, brown rice, or whole grain bread. Fill about one quarter of your plate with whole grains. ? Eat or drink low-fat dairy products, such as skim milk or low-fat yogurt. ? Avoid fatty cuts of meat, processed or cured meats, and poultry with skin. Fill about one quarter of your plate with lean proteins, such as fish, chicken without skin, beans, eggs, and tofu. ? Avoid premade and processed foods. These tend to be higher in sodium, added sugar, and fat.  Reduce your daily  sodium intake. Most people with hypertension should eat less than 1,500 mg of sodium a day.  Limit alcohol intake to no more than 1 drink a day for nonpregnant women and 2 drinks a day for men. One drink equals 12 oz of beer, 5 oz of wine, or 1 oz of hard liquor. Lifestyle  Work with your health care provider to maintain a healthy body weight or to lose weight. Ask what an ideal weight is for you.  Get at least 30 minutes of exercise that causes your heart to beat faster (aerobic exercise) most days of the week. Activities may include walking, swimming, or biking.  Include exercise to strengthen your muscles (resistance exercise), such as pilates or lifting weights, as part of your weekly exercise routine. Try to do these types of exercises for 30 minutes at least 3 days a week.  Do not use any products that contain nicotine or tobacco, such as cigarettes and e-cigarettes. If you need help quitting, ask your health care provider.  Monitor your blood pressure at home as told by your health care provider.  Keep all follow-up visits as told by your health care provider. This is important. Medicines  Take over-the-counter and prescription medicines only as told by your health care provider. Follow directions carefully. Blood pressure medicines must be taken as prescribed.  Do not skip doses of blood pressure medicine. Doing this puts you at risk for problems and can make the medicine less effective.  Ask your health care provider about side effects or reactions to medicines that you should watch for. Contact a health care provider if:  You think you are having a reaction to a medicine you are taking.  You have headaches that keep coming back (recurring).  You feel dizzy.  You have swelling in your ankles.  You have trouble with your vision. Get help right away if:  You develop a severe headache or confusion.  You have unusual weakness or numbness.  You feel faint.  You have  severe pain in your chest or abdomen.  You vomit repeatedly.  You have trouble breathing. Summary  Hypertension is when the force of blood pumping through your arteries is too strong. If this condition is not controlled, it may put you at risk for serious complications.  Your personal target blood pressure may vary depending on your medical conditions, your age, and other factors. For most people, a normal blood pressure is less than 120/80.  Hypertension is treated with lifestyle changes, medicines, or a combination of both. Lifestyle changes include weight loss, eating a healthy, low-sodium diet, exercising more, and limiting alcohol. This information is not intended to replace advice given to you by your health care provider. Make sure you discuss any questions you have with your health care provider. Document Released: 07/31/2005 Document Revised: 06/28/2016 Document Reviewed: 06/28/2016 Elsevier Interactive Patient  Education  2018 [employer].      Agustina Caroli, MD [facility]

## 2018-04-29 NOTE — Patient Instructions (Addendum)
   If you have lab work done today you will be contacted with your lab results within the next 2 weeks.  If you have not heard from us then please contact us. The fastest way to get your results is to register for My Chart.   IF you received an x-ray today, you will receive an invoice from [facility]. Please contact [facility] at [phone] with questions or concerns regarding your invoice.   IF you received labwork today, you will receive an invoice from [facility]. Please contact [facility] at [phone] with questions or concerns regarding your invoice.   Our billing staff will not be able to assist you with questions regarding bills from these companies.  You will be contacted with the lab results as soon as they are available. The fastest way to get your results is to activate your My Chart account. Instructions are located on the last page of this paperwork. If you have not heard from us regarding the results in 2 weeks, please contact this office.    Insomnia Insomnia is a sleep disorder that makes it difficult to fall asleep or to stay asleep. Insomnia can cause tiredness (fatigue), low energy, difficulty concentrating, mood swings, and poor performance at work or school. There are three different ways to classify insomnia:  Difficulty falling asleep.  Difficulty staying asleep.  Waking up too early in the morning.  Any type of insomnia can be long-term (chronic) or short-term (acute). Both are common. Short-term insomnia usually lasts for three months or less. Chronic insomnia occurs at least three times a week for longer than three months. What are the causes? Insomnia may be caused by another condition, situation, or substance, such as:  Anxiety.  Certain medicines.  Gastroesophageal reflux disease (GERD) or other gastrointestinal conditions.  Asthma or other breathing conditions.  Restless legs syndrome, sleep apnea, or other sleep  disorders.  Chronic pain.  Menopause. This may include hot flashes.  Stroke.  Abuse of alcohol, tobacco, or illegal drugs.  Depression.  Caffeine.  Neurological disorders, such as Alzheimer disease.  An overactive thyroid (hyperthyroidism).  The cause of insomnia may not be known. What increases the risk? Risk factors for insomnia include:  Gender. Women are more commonly affected than men.  Age. Insomnia is more common as you get older.  Stress. This may involve your professional or personal life.  Income. Insomnia is more common in people with lower income.  Lack of exercise.  Irregular work schedule or night shifts.  Traveling between different time zones.  What are the signs or symptoms? If you have insomnia, trouble falling asleep or trouble staying asleep is the main symptom. This may lead to other symptoms, such as:  Feeling fatigued.  Feeling nervous about going to sleep.  Not feeling rested in the morning.  Having trouble concentrating.  Feeling irritable, anxious, or depressed.  How is this treated? Treatment for insomnia depends on the cause. If your insomnia is caused by an underlying condition, treatment will focus on addressing the condition. Treatment may also include:  Medicines to help you sleep.  Counseling or therapy.  Lifestyle adjustments.  Follow these instructions at home:  Take medicines only as directed by your health care provider.  Keep regular sleeping and waking hours. Avoid naps.  Keep a sleep diary to help you and your health care provider figure out what could be causing your insomnia. Include: ? When you sleep. ? When you wake up during the night. ? How   well you sleep. ? How rested you feel the next day. ? Any side effects of medicines you are taking. ? What you eat and drink.  Make your bedroom a comfortable place where it is easy to fall asleep: ? Put up shades or special blackout curtains to block light from  outside. ? Use a white noise machine to block noise. ? Keep the temperature cool.  Exercise regularly as directed by your health care provider. Avoid exercising right before bedtime.  Use relaxation techniques to manage stress. Ask your health care provider to suggest some techniques that may work well for you. These may include: ? Breathing exercises. ? Routines to release muscle tension. ? Visualizing peaceful scenes.  Cut back on alcohol, caffeinated beverages, and cigarettes, especially close to bedtime. These can disrupt your sleep.  Do not overeat or eat spicy foods right before bedtime. This can lead to digestive discomfort that can make it hard for you to sleep.  Limit screen use before bedtime. This includes: ? Watching TV. ? Using your smartphone, tablet, and computer.  Stick to a routine. This can help you fall asleep faster. Try to do a quiet activity, brush your teeth, and go to bed at the same time each night.  Get out of bed if you are still awake after 15 minutes of trying to sleep. Keep the lights down, but try reading or doing a quiet activity. When you feel sleepy, go back to bed.  Make sure that you drive carefully. Avoid driving if you feel very sleepy.  Keep all follow-up appointments as directed by your health care provider. This is important. Contact a health care provider if:  You are tired throughout the day or have trouble in your daily routine due to sleepiness.  You continue to have sleep problems or your sleep problems get worse. Get help right away if:  You have serious thoughts about hurting yourself or someone else. This information is not intended to replace advice given to you by your health care provider. Make sure you discuss any questions you have with your health care provider. Document Released: 07/28/2000 Document Revised: 12/31/2015 Document Reviewed: 05/01/2014 Elsevier Interactive Patient Education  2018 [employer].  Hypertension Hypertension, commonly called high blood pressure, is when the force of blood pumping through the arteries is too strong. The arteries are the blood vessels that carry blood from the heart throughout the body. Hypertension forces the heart to work harder to pump blood and may cause arteries to become narrow or stiff. Having untreated or uncontrolled hypertension can cause heart attacks, strokes, kidney disease, and other problems. A blood pressure reading consists of a higher number over a lower number. Ideally, your blood pressure should be below 120/80. The first ("top") number is called the systolic pressure. It is a measure of the pressure in your arteries as your heart beats. The second ("bottom") number is called the diastolic pressure. It is a measure of the pressure in your arteries as the heart relaxes. What are the causes? The cause of this condition is not known. What increases the risk? Some risk factors for high blood pressure are under your control. Others are not. Factors you can change  Smoking.  Having type 2 diabetes mellitus, high cholesterol, or both.  Not getting enough exercise or physical activity.  Being overweight.  Having too much fat, sugar, calories, or salt (sodium) in your diet.  Drinking too much alcohol. Factors that are difficult or impossible to change  Having chronic kidney disease.  Having a family history of high blood pressure.  Age. Risk increases with age.  Race. You may be at higher risk if you are African-American.  Gender. Men are at higher risk than women before age 49. After age 61, women are at higher risk than men.  Having obstructive sleep apnea.  Stress. What are the signs or symptoms? Extremely high blood pressure (hypertensive crisis) may cause:  Headache.  Anxiety.  Shortness of breath.  Nosebleed.  Nausea and vomiting.  Severe chest pain.  Jerky movements you cannot control (seizures).  How  is this diagnosed? This condition is diagnosed by measuring your blood pressure while you are seated, with your arm resting on a surface. The cuff of the blood pressure monitor will be placed directly against the skin of your upper arm at the level of your heart. It should be measured at least twice using the same arm. Certain conditions can cause a difference in blood pressure between your right and left arms. Certain factors can cause blood pressure readings to be lower or higher than normal (elevated) for a short period of time:  When your blood pressure is higher when you are in a health care provider's office than when you are at home, this is called white coat hypertension. Most people with this condition do not need medicines.  When your blood pressure is higher at home than when you are in a health care provider's office, this is called masked hypertension. Most people with this condition may need medicines to control blood pressure.  If you have a high blood pressure reading during one visit or you have normal blood pressure with other risk factors:  You may be asked to return on a different day to have your blood pressure checked again.  You may be asked to monitor your blood pressure at home for 1 week or longer.  If you are diagnosed with hypertension, you may have other blood or imaging tests to help your health care provider understand your overall risk for other conditions. How is this treated? This condition is treated by making healthy lifestyle changes, such as eating healthy foods, exercising more, and reducing your alcohol intake. Your health care provider may prescribe medicine if lifestyle changes are not enough to get your blood pressure under control, and if:  Your systolic blood pressure is above 130.  Your diastolic blood pressure is above 80.  Your personal target blood pressure may vary depending on your medical conditions, your age, and other factors. Follow these  instructions at home: Eating and drinking  Eat a diet that is high in fiber and potassium, and low in sodium, added sugar, and fat. An example eating plan is called the DASH (Dietary Approaches to Stop Hypertension) diet. To eat this way: ? Eat plenty of fresh fruits and vegetables. Try to fill half of your plate at each meal with fruits and vegetables. ? Eat whole grains, such as whole wheat pasta, brown rice, or whole grain bread. Fill about one quarter of your plate with whole grains. ? Eat or drink low-fat dairy products, such as skim milk or low-fat yogurt. ? Avoid fatty cuts of meat, processed or cured meats, and poultry with skin. Fill about one quarter of your plate with lean proteins, such as fish, chicken without skin, beans, eggs, and tofu. ? Avoid premade and processed foods. These tend to be higher in sodium, added sugar, and fat.  Reduce your daily sodium intake. Most  people with hypertension should eat less than 1,500 mg of sodium a day.  Limit alcohol intake to no more than 1 drink a day for nonpregnant women and 2 drinks a day for men. One drink equals 12 oz of beer, 5 oz of wine, or 1 oz of hard liquor. Lifestyle  Work with your health care provider to maintain a healthy body weight or to lose weight. Ask what an ideal weight is for you.  Get at least 30 minutes of exercise that causes your heart to beat faster (aerobic exercise) most days of the week. Activities may include walking, swimming, or biking.  Include exercise to strengthen your muscles (resistance exercise), such as pilates or lifting weights, as part of your weekly exercise routine. Try to do these types of exercises for 30 minutes at least 3 days a week.  Do not use any products that contain nicotine or tobacco, such as cigarettes and e-cigarettes. If you need help quitting, ask your health care provider.  Monitor your blood pressure at home as told by your health care provider.  Keep all follow-up visits as  told by your health care provider. This is important. Medicines  Take over-the-counter and prescription medicines only as told by your health care provider. Follow directions carefully. Blood pressure medicines must be taken as prescribed.  Do not skip doses of blood pressure medicine. Doing this puts you at risk for problems and can make the medicine less effective.  Ask your health care provider about side effects or reactions to medicines that you should watch for. Contact a health care provider if:  You think you are having a reaction to a medicine you are taking.  You have headaches that keep coming back (recurring).  You feel dizzy.  You have swelling in your ankles.  You have trouble with your vision. Get help right away if:  You develop a severe headache or confusion.  You have unusual weakness or numbness.  You feel faint.  You have severe pain in your chest or abdomen.  You vomit repeatedly.  You have trouble breathing. Summary  Hypertension is when the force of blood pumping through your arteries is too strong. If this condition is not controlled, it may put you at risk for serious complications.  Your personal target blood pressure may vary depending on your medical conditions, your age, and other factors. For most people, a normal blood pressure is less than 120/80.  Hypertension is treated with lifestyle changes, medicines, or a combination of both. Lifestyle changes include weight loss, eating a healthy, low-sodium diet, exercising more, and limiting alcohol. This information is not intended to replace advice given to you by your health care provider. Make sure you discuss any questions you have with your health care provider. Document Released: 07/31/2005 Document Revised: 06/28/2016 Document Reviewed: 06/28/2016 Elsevier Interactive Patient Education  [employer].

## 2018-05-31 ENCOUNTER — Telehealth: Payer: Self-pay | Admitting: Family Medicine

## 2018-05-31 NOTE — Telephone Encounter (Signed)
Called and spoke with pt regarding their appt with Dr. Brigitte Pulse on 07/02/18. I was able to move their appt from 10:40 to 10:30 due to the template changes on [facility] schedule.  I advised of time, building number and late policy.

## 2018-06-03 IMAGING — DX DG KNEE 1-2V*R*
2 series · 2 of 2 positions shown · non-contrast
Comparison: None.

CLINICAL DATA: severe pain over anterior medial aspect of Rt Knee x
2 wks after overuse, twisting. antalgic gait. nki. no h/o prior pain

EXAM:
RIGHT KNEE - 1-2 VIEW

[knee ap]
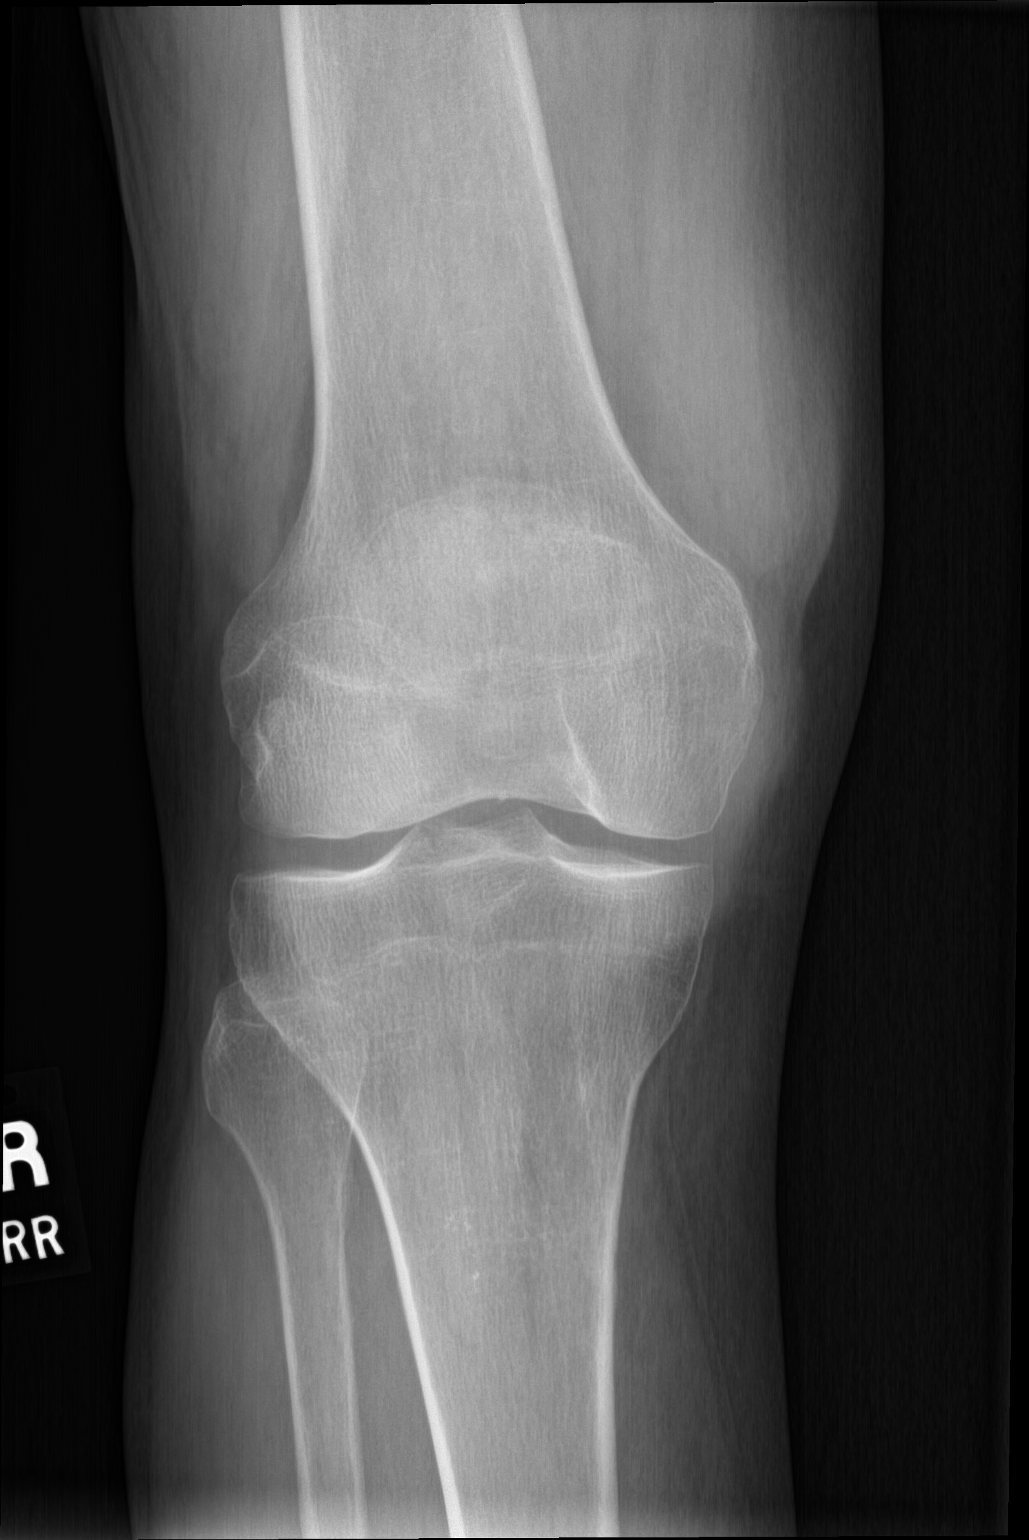

[knee lat]
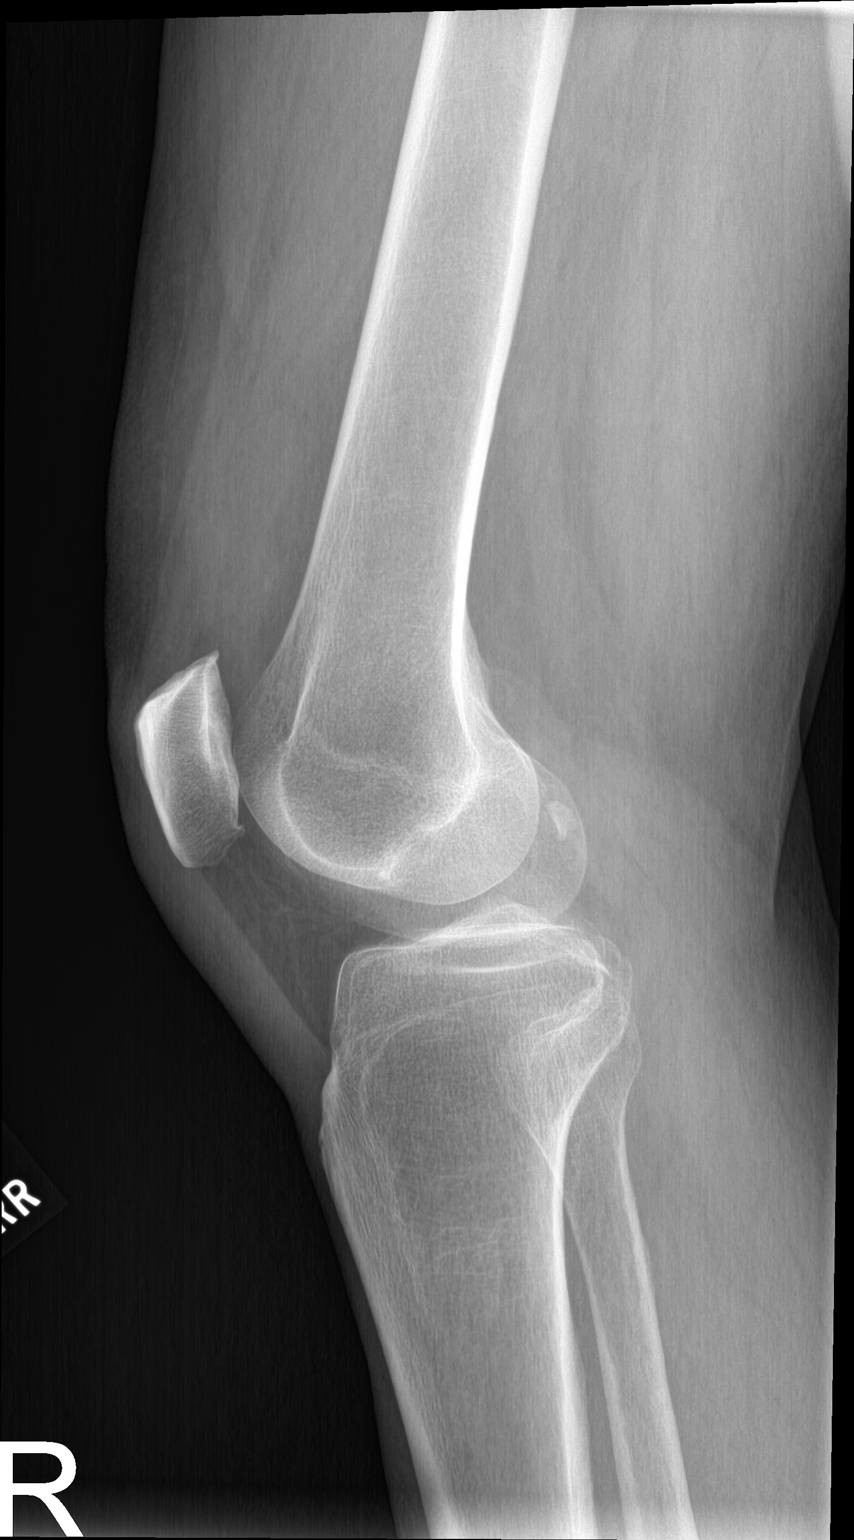

[2 of 2 positions shown; findings below may reference images not displayed]

FINDINGS: No fracture of the proximal tibia or distal femur. Patella is
normal. SMALL JOINT EFFUSION. SOFT TISSUE SWELLING SUPEROLATERAL TO
THE MEDIAL FEMORAL CONDYLE as well as lateral to the medial femoral
condyle.
IMPRESSION: No fracture or dislocation.  NO ARTHROPATHY.

Soft tissue swelling superolateral to the medial femoral condyle.

Small effusion.

## 2018-07-02 ENCOUNTER — Ambulatory Visit (INDEPENDENT_AMBULATORY_CARE_PROVIDER_SITE_OTHER): Payer: BLUE CROSS/BLUE SHIELD | Admitting: Family Medicine

## 2018-07-02 ENCOUNTER — Encounter: Payer: Self-pay | Admitting: Family Medicine

## 2018-07-02 ENCOUNTER — Other Ambulatory Visit: Payer: Self-pay

## 2018-07-02 VITALS — BP 125/78 | HR 70 | Temp 98.0°F | Resp 16 | Ht 74.61 in | Wt 203.0 lb

## 2018-07-02 DIAGNOSIS — I10 Essential (primary) hypertension: Secondary | ICD-10-CM

## 2018-07-02 DIAGNOSIS — G4700 Insomnia, unspecified: Secondary | ICD-10-CM

## 2018-07-02 DIAGNOSIS — F419 Anxiety disorder, unspecified: Secondary | ICD-10-CM | POA: Diagnosis not present

## 2018-07-02 DIAGNOSIS — E782 Mixed hyperlipidemia: Secondary | ICD-10-CM | POA: Diagnosis not present

## 2018-07-02 MED ORDER — DOXEPIN HCL 25 MG PO CAPS: 25.0000 mg | ORAL_CAPSULE | Freq: Every day | ORAL | 3 refills | Status: DC

## 2018-07-02 MED ORDER — ALPRAZOLAM 1 MG PO TABS: 1.0000 mg | ORAL_TABLET | Freq: Every evening | ORAL | 5 refills | Status: DC | PRN

## 2018-07-02 MED ORDER — AMLODIPINE BESYLATE 5 MG PO TABS: 5.0000 mg | ORAL_TABLET | Freq: Every day | ORAL | 3 refills | Status: AC

## 2018-07-02 NOTE — Patient Instructions (Addendum)
If you have lab work done today you will be contacted with your lab results within the next 2 weeks.  If you have not heard from [place] then please contact us. The fastest way to get your results is to register for My Chart.   IF you received an x-ray today, you will receive an invoice from [facility]. Please contact [facility] at [phone] with questions or concerns regarding your invoice.   IF you received labwork today, you will receive an invoice from [facility]. Please contact [facility] at [phone] with questions or concerns regarding your invoice.   Our billing staff will not be able to assist you with questions regarding bills from these companies.  You will be contacted with the lab results as soon as they are available. The fastest way to get your results is to activate your My Chart account. Instructions are located on the last page of this paperwork. If you have not heard from [place] regarding the results in 2 weeks, please contact this office.      Preventing Unhealthy Weight Gain, Adult Staying at a healthy weight is important. When fat builds up in your body, you may become overweight or obese. These conditions put you at greater risk for developing certain health problems, such as heart disease, diabetes, sleeping problems, joint problems, and some cancers. Unhealthy weight gain is often the result of making unhealthy choices in what you eat. It is also a result of not getting enough exercise. You can make changes to your lifestyle to prevent obesity and stay as healthy as possible. What nutrition changes can be made? To maintain a healthy weight and prevent obesity:  Eat only as much as your body needs. To do this: ? Pay attention to signs that you are hungry or full. Stop eating as soon as you feel full. ? If you feel hungry, try drinking water first. Drink enough water so your urine is clear or pale yellow. ? Eat smaller portions. ? Look at serving  sizes on food labels. Most foods contain more than one serving per container. ? Eat the recommended amount of calories for your gender and activity level. While most active people should eat around 2,000 calories per day, if you are trying to lose weight or are not very active, you main need to eat less calories. Talk to your health care provider or dietitian about how many calories you should eat each day.  Choose healthy foods, such as: ? Fruits and vegetables. Try to fill at least half of your plate at each meal with fruits and vegetables. ? Whole grains, such as whole wheat bread, brown rice, and quinoa. ? Lean meats, such as chicken or fish. ? Other healthy proteins, such as beans, eggs, or tofu. ? Healthy fats, such as nuts, seeds, fatty fish, and olive oil. ? Low-fat or fat-free dairy.  Check food labels and avoid food and drinks that: ? Are high in calories. ? Have added sugar. ? Are high in sodium. ? Have saturated fats or trans fats.  Limit how much you eat of the following foods: ? Prepackaged meals. ? Fast food. ? Fried foods. ? Processed meat, such as bacon, sausage, and deli meats. ? Fatty cuts of red meat and poultry with skin.  Cook foods in healthier ways, such as by baking, broiling, or grilling.  When grocery shopping, try to shop around the outside of the store. This helps you buy mostly fresh foods and avoid canned and  prepackaged foods.  What lifestyle changes can be made?  Exercise at least 30 minutes 5 or more days each week. Exercising includes brisk walking, yard work, biking, running, swimming, and team sports like basketball and soccer. Ask your health care provider which exercises are safe for you.  Do not use any products that contain nicotine or tobacco, such as cigarettes and e-cigarettes. If you need help quitting, ask your health care provider.  Limit alcohol intake to no more than 1 drink a day for nonpregnant women and 2 drinks a day for men. One  drink equals 12 oz of beer, 5 oz of wine, or 1 oz of hard liquor.  Try to get 7-9 hours of sleep each night. What other changes can be made?  Keep a food and activity journal to keep track of: ? What you ate and how many calories you had. Remember to count sauces, dressings, and side dishes. ? Whether you were active, and what exercises you did. ? Your calorie, weight, and activity goals.  Check your weight regularly. Track any changes. If you notice you have gained weight, make changes to your diet or activity routine.  Avoid taking weight-loss medicines or supplements. Talk to your health care provider before starting any new medicine or supplement.  Talk to your health care provider before trying any new diet or exercise plan. Why are these changes important? Eating healthy, staying active, and having healthy habits not only help prevent obesity, they also:  Help you to manage stress and emotions.  Help you to connect with friends and family.  Improve your self-esteem.  Improve your sleep.  Prevent long-term health problems.  What can happen if changes are not made? Being obese or overweight can cause you to develop joint or bone problems, which can make it hard for you to stay active or do activities you enjoy. Being obese or overweight also puts stress on your heart and lungs and can lead to health problems like diabetes, heart disease, and some cancers. Where to find more information: Talk with your health care provider or a dietitian about healthy eating and healthy lifestyle choices. You may also find other information through these resources:  [place] Department of [employer] MyPlate: FormerBoss.no  American Heart Association: www.heart.org  Centers for Disease Control and Prevention: http://www.wolf.info/  Summary  Staying at a healthy weight is important. It helps prevent certain diseases and health problems, such as heart disease, diabetes, joint problems, sleep  disorders, and some cancers.  Being obese or overweight can cause you to develop joint or bone problems, which can make it hard for you to stay active or do activities you enjoy.  You can prevent unhealthy weight gain by eating a healthy diet, exercising regularly, not smoking, limiting alcohol, and getting enough sleep.  Talk with your health care provider or a dietitian for guidance about healthy eating and healthy lifestyle choices. This information is not intended to replace advice given to you by your health care provider. Make sure you discuss any questions you have with your health care provider. Document Released: 08/01/2016 Document Revised: 09/06/2016 Document Reviewed: 09/06/2016 Elsevier Interactive Patient Education  [employer].

## 2018-07-02 NOTE — Progress Notes (Signed)
Subjective:    Patient: Tanner Webb  DOB: 06-29-1951; 67 y.o.   MRN: [id number]  Chief Complaint  Patient presents with  . Anxiety    follow-up   . Hypertension    HPI Overall doing well but hasn't been sleeping well. When he saw one of my partners, he was decreased to 1 tab po qd from 2 tabs prior.  Still on the doxepin which is also working well.  Sometimes takes 1 tab po qhs and then wakes up and needs a 2nd, other times he takes 2 pills together. Last Jan was on diet x 4 mos and riding stationary bike 13-18 mi/d, taking vitamins, eating right.  Lost 45 lbs. Has stopped biking now.  Medical History Past Medical History:  Diagnosis Date  . Anxiety   . Atypical chest pain    a. 04/2015 Lexiscan MV: Ef 56%, no ischemia/infarct.  . Blood transfusion without reported diagnosis   . Chronic pain in left shoulder   . Hepatitis C infection, cured    resolved after treatment wiht Harvoni  . Hyperlipidemia   . Hypertension   . RBBB    a. 04/2015 - first noted.   Past Surgical History:  Procedure Laterality Date  . CHOLECYSTECTOMY    . FRACTURE SURGERY    . HERNIA REPAIR    . LAPAROTOMY N/A 06/10/2014   Procedure: EXPLORATORY LAPAROTOMY, ventral hernia repair;  Surgeon: Donnie Mesa, MD;  Location: [facility];  Service: General;  Laterality: N/A;  . SPLENECTOMY    . VENTRAL HERNIA REPAIR  06/10/2014   Current Outpatient Medications on File Prior to Visit  Medication Sig Dispense Refill  . ALPRAZolam (XANAX) 1 MG tablet Take 1 tablet (1 mg total) by mouth at bedtime as needed for anxiety. 60 tablet 0  . amLODipine (NORVASC) 5 MG tablet Take 1 tablet (5 mg total) by mouth daily. 90 tablet 3  . aspirin EC 81 MG EC tablet Take 1 tablet (81 mg total) by mouth daily.    Marland Kitchen Co-Enzyme Q-10 100 MG CAPS Take by mouth.    . doxepin (SINEQUAN) 25 MG capsule Take 1 capsule (25 mg total) by mouth at bedtime. 90 capsule 0  . magnesium 30 MG tablet Take 30 mg by mouth 2 (two) times daily.      . Multiple Vitamins-Minerals (MULTIVITAMIN WITH MINERALS) tablet Take 1 tablet by mouth daily.     No current facility-administered medications on file prior to visit.    Allergies  Allergen Reactions  . Penicillins Swelling and Other (See Comments)    "leshions in my mouth"  . Sulfa Antibiotics Swelling   Family History  Problem Relation Age of Onset  . Mental illness Mother   . Alzheimer's disease Mother   . Heart disease Father   . Hypertension Father    Social History   Socioeconomic History  . Marital status: Single    Spouse name: Not on file  . Number of children: Not on file  . Years of education: Not on file  . Highest education level: Not on file  Occupational History  . Not on file  Social Needs  . Financial resource strain: Not on file  . Food insecurity:    Worry: Not on file    Inability: Not on file  . Transportation needs:    Medical: Not on file    Non-medical: Not on file  Tobacco Use  . Smoking status: Former Smoker    Last attempt to quit:  04/30/2014    Years since quitting: 4.1  . Smokeless tobacco: Never Used  Substance and Sexual Activity  . Alcohol use: No    Alcohol/week: 0.0 standard drinks  . Drug use: No  . Sexual activity: Yes  Lifestyle  . Physical activity:    Days per week: Not on file    Minutes per session: Not on file  . Stress: Not on file  Relationships  . Social connections:    Talks on phone: Not on file    Gets together: Not on file    Attends religious service: Not on file    Active member of club or organization: Not on file    Attends meetings of clubs or organizations: Not on file    Relationship status: Not on file  Other Topics Concern  . Not on file  Social History Narrative  . Not on file   Depression screen [facility] 2/9 07/02/2018 04/29/2018 09/03/2017 08/23/2017 07/25/2017  Decreased Interest 1 0 0 0 0  Down, Depressed, Hopeless 1 0 0 0 0  PHQ - 2 Score 2 0 0 0 0  Altered sleeping 3 - - - -  Tired,  decreased energy 2 - - - -  Change in appetite 0 - - - -  Feeling bad or failure about yourself  0 - - - -  Trouble concentrating 0 - - - -  Moving slowly or fidgety/restless 0 - - - -  Suicidal thoughts 0 - - - -  PHQ-9 Score 7 - - - -  Difficult doing work/chores - - - - -    ROS As noted in HPI  Objective:  BP 125/78   Pulse 70   Temp 98 F (36.7 C) (Oral)   Resp 16   Ht 6' 2.61" (1.895 m)   Wt 203 lb (92.1 kg)   SpO2 95%   BMI 25.64 kg/m  Physical Exam  Constitutional: He is oriented to person, place, and time. He appears well-developed and well-nourished. No distress.  HENT:  Head: Normocephalic and atraumatic.  Eyes: Pupils are equal, round, and reactive to light. Conjunctivae are normal. No scleral icterus.  Neck: Normal range of motion. Neck supple. No thyromegaly present.  Cardiovascular: Normal rate, regular rhythm, normal heart sounds and intact distal pulses.  Pulmonary/Chest: Effort normal and breath sounds normal. No respiratory distress.  Musculoskeletal: He exhibits no edema.  Lymphadenopathy:    He has no cervical adenopathy.  Neurological: He is alert and oriented to person, place, and time.  Skin: Skin is warm and dry. He is not diaphoretic.  Psychiatric: He has a normal mood and affect. His behavior is normal.     Assessment & Plan:   1. Essential hypertension   2. Insomnia, unspecified type   3. Anxiety   4. Mixed hyperlipidemia     Patient will continue on current chronic medications other than changes noted above, so ok to refill when needed.   See after visit summary for patient specific instructions.    Meds ordered this encounter  Medications  . ALPRAZolam (XANAX) 1 MG tablet    Sig: Take 1 tablet (1 mg total) by mouth at bedtime as needed and may repeat dose one time if needed for sleep.    Dispense:  60 tablet    Refill:  5  . amLODipine (NORVASC) 5 MG tablet    Sig: Take 1 tablet (5 mg total) by mouth daily.    Dispense:  90  tablet  Refill:  3  . doxepin (SINEQUAN) 25 MG capsule    Sig: Take 1 capsule (25 mg total) by mouth at bedtime.    Dispense:  90 capsule    Refill:  3    Patient verbalized to me that they understand the following: diagnosis, what is being done for them, what to expect and what should be done at home.  Their questions have been answered. They understand that I am unable to predict every possible medication interaction or adverse outcome and that if any unexpected symptoms arise, they should contact us and their pharmacist, as well as never hesitate to seek urgent/emergent care at [facility] or ER if they think it might be warranted.    Delman Cheadle, MD, MPH [facility] [address] [place], [place]  [id number] [phone] Office phone  [phone] Office fax  07/02/18 10:52 AM

## 2018-08-23 ENCOUNTER — Telehealth: Payer: Self-pay | Admitting: Family Medicine

## 2018-08-23 NOTE — Telephone Encounter (Signed)
Called and spoke with pt regarding their appt for Monday for AWV. We moved the appt to 11:00. Pt acknowledged.

## 2018-08-26 ENCOUNTER — Ambulatory Visit (INDEPENDENT_AMBULATORY_CARE_PROVIDER_SITE_OTHER): Payer: Medicare Other | Admitting: Family Medicine

## 2018-08-26 VITALS — BP 115/78 | Ht 75.0 in | Wt 209.2 lb

## 2018-08-26 DIAGNOSIS — Z23 Encounter for immunization: Secondary | ICD-10-CM

## 2018-08-26 DIAGNOSIS — Z0000 Encounter for general adult medical examination without abnormal findings: Secondary | ICD-10-CM

## 2018-08-26 NOTE — Patient Instructions (Signed)
Healthy Eating Following a healthy eating pattern may help you to achieve and maintain a healthy body weight, reduce the risk of chronic disease, and live a long and productive life. It is important to follow a healthy eating pattern at an appropriate calorie level for your body. Your nutritional needs should be met primarily through food by choosing a variety of nutrient-rich foods. What are tips for following this plan? Reading food labels  Read labels and choose the following: ? Reduced or low sodium. ? Juices with 100% fruit juice. ? Foods with low saturated fats and high polyunsaturated and monounsaturated fats. ? Foods with whole grains, such as whole wheat, cracked wheat, brown rice, and wild rice. ? Whole grains that are fortified with folic acid. This is recommended for women who are pregnant or who want to become pregnant.  Read labels and avoid the following: ? Foods with a lot of added sugars. These include foods that contain brown sugar, corn sweetener, corn syrup, dextrose, fructose, glucose, high-fructose corn syrup, honey, invert sugar, lactose, malt syrup, maltose, molasses, raw sugar, sucrose, trehalose, or turbinado sugar.  Do not eat more than the following amounts of added sugar per day:  6 teaspoons (25 g) for women.  9 teaspoons (38 g) for men. ? Foods that contain processed or refined starches and grains. ? Refined grain products, such as white flour, degermed cornmeal, white bread, and white rice. Shopping  Choose nutrient-rich snacks, such as vegetables, whole fruits, and nuts. Avoid high-calorie and high-sugar snacks, such as potato chips, fruit snacks, and candy.  Use oil-based dressings and spreads on foods instead of solid fats such as butter, stick margarine, or cream cheese.  Limit pre-made sauces, mixes, and "instant" products such as flavored rice, instant noodles, and ready-made pasta.  Try more plant-protein sources, such as tofu, tempeh, black beans,  edamame, lentils, nuts, and seeds.  Explore eating plans such as the Mediterranean diet or vegetarian diet. Cooking  Use oil to saut or stir-fry foods instead of solid fats such as butter, stick margarine, or lard.  Try baking, boiling, grilling, or broiling instead of frying.  Remove the fatty part of meats before cooking.  Steam vegetables in water or broth. Meal planning   At meals, imagine dividing your plate into fourths: ? One-half of your plate is fruits and vegetables. ? One-fourth of your plate is whole grains. ? One-fourth of your plate is protein, especially lean meats, poultry, eggs, tofu, beans, or nuts.  Include low-fat dairy as part of your daily diet. Lifestyle  Choose healthy options in all settings, including home, work, school, restaurants, or stores.  Prepare your food safely: ? Wash your hands after handling raw meats. ? Keep food preparation surfaces clean by regularly washing with hot, soapy water. ? Keep raw meats separate from ready-to-eat foods, such as fruits and vegetables. ? Cook seafood, meat, poultry, and eggs to the recommended internal temperature. ? Store foods at safe temperatures. In general:  Keep cold foods at 59F (4.4C) or below.  Keep hot foods at 159F (60C) or above.  Keep your freezer at [facility] (-17.8C) or below.  Foods are no longer safe to eat when they have been between the temperatures of 40-159F (4.4-60C) for more than 2 hours. What foods should I eat? Fruits Aim to eat 2 cup-equivalents of fresh, canned (in natural juice), or frozen fruits each day. Examples of 1 cup-equivalent of fruit include 1 small apple, 8 large strawberries, 1 cup canned fruit,  cup  for more than 2 hours.  What foods should I eat?  Fruits  Aim to eat 2 cup-equivalents of fresh, canned (in natural juice), or frozen fruits each day. Examples of 1 cup-equivalent of fruit include 1 small apple, 8 large strawberries, 1 cup canned fruit,  cup dried fruit, or 1 cup 100% juice.  Vegetables  Aim to eat 2-3 cup-equivalents of fresh and frozen vegetables each day, including different varieties and colors. Examples of 1 cup-equivalent of vegetables include 2 medium carrots, 2 cups raw, leafy greens, 1 cup chopped  vegetable (raw or cooked), or 1 medium baked potato.  Grains  Aim to eat 6 ounce-equivalents of whole grains each day. Examples of 1 ounce-equivalent of grains include 1 slice of bread, 1 cup ready-to-eat cereal, 3 cups popcorn, or  cup cooked rice, pasta, or cereal.  Meats and other proteins  Aim to eat 5-6 ounce-equivalents of protein each day. Examples of 1 ounce-equivalent of protein include 1 egg, 1/2 cup nuts or seeds, or 1 tablespoon (16 g) peanut butter. A cut of meat or fish that is the size of a deck of cards is about 3-4 ounce-equivalents.   Of the protein you eat each week, try to have at least 8 ounces come from seafood. This includes salmon, trout, herring, and anchovies.  Dairy  Aim to eat 3 cup-equivalents of fat-free or low-fat dairy each day. Examples of 1 cup-equivalent of dairy include 1 cup (240 mL) milk, 8 ounces (250 g) yogurt, 1 ounces (44 g) natural cheese, or 1 cup (240 mL) fortified soy milk.  Fats and oils   Aim for about 5 teaspoons (21 g) per day. Choose monounsaturated fats, such as canola and olive oils, avocados, peanut butter, and most nuts, or polyunsaturated fats, such as sunflower, corn, and soybean oils, walnuts, pine nuts, sesame seeds, sunflower seeds, and flaxseed.  Beverages   Aim for six 8-oz glasses of water per day. Limit coffee to three to five 8-oz cups per day.   Limit caffeinated beverages that have added calories, such as soda and energy drinks.   Limit alcohol intake to no more than 1 drink a day for nonpregnant women and 2 drinks a day for men. One drink equals 12 oz of beer (355 mL), 5 oz of wine (148 mL), or 1 oz of hard liquor (44 mL).  Seasoning and other foods   Avoid adding excess amounts of salt to your foods. Try flavoring foods with herbs and spices instead of salt.   Avoid adding sugar to foods.   Try using oil-based dressings, sauces, and spreads instead of solid fats.  This information is based on general [place] nutrition guidelines. For more  information, visit choosemyplate.gov. Exact amounts may vary based on your nutrition needs.  Summary   A healthy eating plan may help you to maintain a healthy weight, reduce the risk of chronic diseases, and stay active throughout your life.   Plan your meals. Make sure you eat the right portions of a variety of nutrient-rich foods.   Try baking, boiling, grilling, or broiling instead of frying.   Choose healthy options in all settings, including home, work, school, restaurants, or stores.  This information is not intended to replace advice given to you by your health care provider. Make sure you discuss any questions you have with your health care provider.  Document Released: 11/12/2017 Document Revised: 11/12/2017 Document Reviewed: 11/12/2017  Elsevier Interactive Patient Education  2019 Elsevier Inc[employer]

## 2018-08-26 NOTE — Progress Notes (Addendum)
Presents today for [employer] Visit      Interpreter used for this visit?no  This visit was done in person at Primary care at [facility]  Patient Care Team: Forrest Moron, MD as PCP - General (Internal Medicine) Harl Bowie Alphonse Guild, MD as Consulting Physician (Cardiology) Nyoka Cowden Marijean Heath, MD as Consulting Physician (Gastroenterology)    Immunization status:  Immunization History  Administered Date(s) Administered  . Hepatitis A 06/08/2008, 01/01/2009  . Hepatitis B, adult 06/08/2014  . Influenza Split 05/15/2012  . Influenza, High Dose Seasonal PF 05/31/2017  . Influenza,inj,Quad PF,6+ Mos 06/08/2014, 06/30/2015, 04/29/2018  . Meningococcal Conjugate 03/30/2012  . Pneumococcal Conjugate-13 08/26/2018  . Pneumococcal Polysaccharide-23 12/12/2008  . Tdap 09/14/2009, 07/25/2017     There are no preventive care reminders to display for this patient.   Functional Status Survey: Is the patient deaf or have difficulty hearing?: No Does the patient have difficulty seeing, even when wearing glasses/contacts?: No Does the patient have difficulty concentrating, remembering, or making decisions?: Yes Does the patient have difficulty walking or climbing stairs?: No Does the patient have difficulty dressing or bathing?: No Does the patient have difficulty doing errands alone such as visiting a doctor's office or shopping?: No   6CIT Screen 08/26/2018  What Year? 0 points  What time? 0 points  Count back from 20 0 points  Months in reverse 0 points  Repeat phrase 0 points          Patient Active Problem List   Diagnosis Date Noted  . Acute pharyngitis 11/27/2018  . Insomnia 08/03/2017  . Anxiety 12/19/2015  . Essential hypertension 05/08/2015  . Hyperlipidemia 05/08/2015  . Atypical chest pain 05/07/2015  . Incarcerated ventral hernia 06/10/2014  . Chronic pain in left shoulder 03/30/2012  . Anemia 03/30/2012     Past Medical History:   Diagnosis Date  . Anxiety   . Atypical chest pain    a. 04/2015 Lexiscan MV: Ef 56%, no ischemia/infarct.  . Blood transfusion without reported diagnosis   . Chronic pain in left shoulder   . Hepatitis C infection, cured    resolved after treatment wiht Harvoni  . Hyperlipidemia   . Hypertension   . RBBB    a. 04/2015 - first noted.     Past Surgical History:  Procedure Laterality Date  . CHOLECYSTECTOMY    . FRACTURE SURGERY    . HERNIA REPAIR    . LAPAROTOMY N/A 06/10/2014   Procedure: EXPLORATORY LAPAROTOMY, ventral hernia repair;  Surgeon: Donnie Mesa, MD;  Location: [facility];  Service: General;  Laterality: N/A;  . SPLENECTOMY    . VENTRAL HERNIA REPAIR  06/10/2014     Family History  Problem Relation Age of Onset  . Mental illness Mother   . Alzheimer's disease Mother   . Heart disease Father   . Hypertension Father      Social History   Socioeconomic History  . Marital status: Single    Spouse name: Not on file  . Number of children: Not on file  . Years of education: Not on file  . Highest education level: Not on file  Occupational History  . Not on file  Social Needs  . Financial resource strain: Not on file  . Food insecurity:    Worry: Not on file    Inability: Not on file  . Transportation needs:    Medical: Not on file    Non-medical: Not on file  Tobacco Use  .  Smoking status: Former Smoker    Last attempt to quit: 04/30/2014    Years since quitting: 4.6  . Smokeless tobacco: Never Used  Substance and Sexual Activity  . Alcohol use: No    Alcohol/week: 0.0 standard drinks  . Drug use: No  . Sexual activity: Yes  Lifestyle  . Physical activity:    Days per week: Not on file    Minutes per session: Not on file  . Stress: Not on file  Relationships  . Social connections:    Talks on phone: Not on file    Gets together: Not on file    Attends religious service: Not on file    Active member of club or organization: Not on file     Attends meetings of clubs or organizations: Not on file    Relationship status: Not on file  . Intimate partner violence:    Fear of current or ex partner: Not on file    Emotionally abused: Not on file    Physically abused: Not on file    Forced sexual activity: Not on file  Other Topics Concern  . Not on file  Social History Narrative  . Not on file     Allergies  Allergen Reactions  . Penicillins Swelling and Other (See Comments)    "leshions in my mouth"  . Sulfa Antibiotics Swelling     Prior to Admission medications   Medication Sig Start Date End Date Taking? Authorizing Provider  amLODipine (NORVASC) 5 MG tablet Take 1 tablet (5 mg total) by mouth daily. 07/02/18  Yes Shawnee Knapp, MD  aspirin EC 81 MG EC tablet Take 1 tablet (81 mg total) by mouth daily. 05/08/15  Yes Theora Gianotti, NP  Co-Enzyme Q-10 100 MG CAPS Take by mouth.   Yes [provider]  magnesium 30 MG tablet Take 30 mg by mouth 2 (two) times daily.   Yes [provider]  Multiple Vitamins-Minerals (MULTIVITAMIN WITH MINERALS) tablet Take 1 tablet by mouth daily.   Yes [provider]  ALPRAZolam Duanne Moron) 1 MG tablet Take 1 tablet (1 mg total) by mouth at bedtime as needed and may repeat dose one time if needed for sleep. 12/17/18   Forrest Moron, MD  doxepin (SINEQUAN) 25 MG capsule Take 1 capsule (25 mg total) by mouth at bedtime. 12/17/18   Forrest Moron, MD     Depression screen [facility] 2/9 12/17/2018 11/27/2018 10/16/2018 08/26/2018 07/02/2018  Decreased Interest 0 0 0 0 1  Down, Depressed, Hopeless 0 0 0 0 1  PHQ - 2 Score 0 0 0 0 2  Altered sleeping - - - - 3  Tired, decreased energy - - - - 2  Change in appetite - - - - 0  Feeling bad or failure about yourself  - - - - 0  Trouble concentrating - - - - 0  Moving slowly or fidgety/restless - - - - 0  Suicidal thoughts - - - - 0  PHQ-9 Score - - - - 7  Difficult doing work/chores - - - - -  Some recent data might  be hidden     Fall Risk  12/17/2018 11/27/2018 10/16/2018 08/26/2018 08/26/2018  Falls in the past year? 0 0 0 0 0  Number falls in past yr: 0 - - - -  Injury with Fall? 0 0 0 - 0  Comment - - - - -  Risk for fall due to : - - - -  Other (Comment)  Follow up Falls evaluation completed - - - -      PHYSICAL EXAM: BP 115/78 (BP Location: Right Arm, Patient Position: Sitting, Cuff Size: Normal)   Ht 6\' 3"  (1.905 m)   Wt 209 lb 3.2 oz (94.9 kg)   BMI 26.15 kg/m    Wt Readings from Last 3 Encounters:  12/17/18 205 lb (93 kg)  10/16/18 202 lb (91.6 kg)  08/26/18 209 lb 3.2 oz (94.9 kg)     No exam data present    Physical Exam   Education/Counseling provided regarding diet and exercise, prevention of chronic diseases, smoking/tobacco cessation, if applicable, and reviewed "Covered Medicare Preventive Services."   ASSESSMENT/PLAN: 1. Medicare welcome exam  Other orders - Pneumococcal conjugate vaccine 13-valent IM      Patient lives in [place] with his fiance   Daughter moved to [place] lives next street over with her 4 daughter.   Retired at 57   Breakfast : nothin Lunch : Astronomer ,  Dinner:  Lawyer, grilled chicken, brussels sprouts  Diet mountain dew caffeine free 2 liters a day  Exercise:  Golf 3 days a week, exercise  Bike  Keep up with yard work.      Has a [employer] motor cycle likes riding his bike. Prostate Cancer: Pt does not see an urologist STI screening: declines screening at this visit  Colorectal Cancer: colonoscopy done 01/05/15 at [facility] by Dr. Nyoka Cowden, 1 polyp in descending colon, plastic and hyperplastic polyp in sigmoid  Tobacco use/AAA/Lung/EtOH/Illicit substances: pt is a former smoker; started at age 70 x 92 yrs Cardiac: 04/2015 lexiscan EF 56%, no ischemia, EKG 05/08/15, seen by Dr. Harl Bowie 07/02/15 f/u as needed

## 2018-08-27 NOTE — Addendum Note (Signed)
Addended by: Suszanne Finch on: 08/27/2018 09:18 AM   Modules accepted: Level of Service

## 2018-09-21 ENCOUNTER — Other Ambulatory Visit: Payer: Self-pay | Admitting: Emergency Medicine

## 2018-09-21 DIAGNOSIS — G4700 Insomnia, unspecified: Secondary | ICD-10-CM

## 2018-10-06 DIAGNOSIS — F17290 Nicotine dependence, other tobacco product, uncomplicated: Secondary | ICD-10-CM | POA: Diagnosis not present

## 2018-10-06 DIAGNOSIS — Z79899 Other long term (current) drug therapy: Secondary | ICD-10-CM | POA: Diagnosis not present

## 2018-10-06 DIAGNOSIS — G5603 Carpal tunnel syndrome, bilateral upper limbs: Secondary | ICD-10-CM | POA: Diagnosis not present

## 2018-10-06 DIAGNOSIS — G5613 Other lesions of median nerve, bilateral upper limbs: Secondary | ICD-10-CM | POA: Diagnosis not present

## 2018-10-14 ENCOUNTER — Ambulatory Visit: Payer: Medicare Other | Admitting: Family Medicine

## 2018-10-16 ENCOUNTER — Encounter: Payer: Self-pay | Admitting: Family Medicine

## 2018-10-16 ENCOUNTER — Ambulatory Visit (INDEPENDENT_AMBULATORY_CARE_PROVIDER_SITE_OTHER): Payer: Medicare Other

## 2018-10-16 ENCOUNTER — Other Ambulatory Visit: Payer: Self-pay

## 2018-10-16 ENCOUNTER — Ambulatory Visit (INDEPENDENT_AMBULATORY_CARE_PROVIDER_SITE_OTHER): Payer: Medicare Other | Admitting: Family Medicine

## 2018-10-16 VITALS — BP 118/76 | HR 68 | Temp 98.0°F | Resp 16 | Ht 75.0 in | Wt 202.0 lb

## 2018-10-16 DIAGNOSIS — M7989 Other specified soft tissue disorders: Secondary | ICD-10-CM | POA: Diagnosis not present

## 2018-10-16 DIAGNOSIS — M79642 Pain in left hand: Secondary | ICD-10-CM

## 2018-10-16 DIAGNOSIS — M67442 Ganglion, left hand: Secondary | ICD-10-CM

## 2018-10-16 NOTE — Patient Instructions (Addendum)
   If you have lab work done today you will be contacted with your lab results within the next 2 weeks.  If you have not heard from us then please contact us. The fastest way to get your results is to register for My Chart.   IF you received an x-ray today, you will receive an invoice from [facility]. Please contact [facility] at [phone] with questions or concerns regarding your invoice.   IF you received labwork today, you will receive an invoice from [facility]. Please contact [facility] at [phone] with questions or concerns regarding your invoice.   Our billing staff will not be able to assist you with questions regarding bills from these companies.  You will be contacted with the lab results as soon as they are available. The fastest way to get your results is to activate your My Chart account. Instructions are located on the last page of this paperwork. If you have not heard from us regarding the results in 2 weeks, please contact this office.     Ganglion Cyst  A ganglion cyst is a non-cancerous, fluid-filled lump that occurs near a joint or tendon. The cyst grows out of a joint or the lining of a tendon. Ganglion cysts most often develop in the hand or wrist, but they can also develop in the shoulder, elbow, hip, knee, ankle, or foot. Ganglion cysts are ball-shaped or egg-shaped. Their size can range from the size of a pea to larger than a grape. Increased activity may cause the cyst to get bigger because more fluid starts to build up. What are the causes? The exact cause of this condition is not known, but it may be related to:  Inflammation or irritation around the joint.  An injury.  Repetitive movements or overuse.  Arthritis. What increases the risk? You are more likely to develop this condition if:  You are a woman.  You are 15-40 years old. What are the signs or symptoms? The main symptom of this condition is a lump. It most often appears  on the hand or wrist. In many cases, there are no other symptoms, but a cyst can sometimes cause:  Tingling.  Pain.  Numbness.  Muscle weakness.  Weak grip.  Less range of motion in a joint. How is this diagnosed? Ganglion cysts are usually diagnosed based on a physical exam. Your health care provider will feel the lump and may shine a light next to it. If it is a ganglion cyst, the light will likely shine through it. Your health care provider may order an X-ray, ultrasound, or MRI to rule out other conditions. How is this treated? Ganglion cysts often go away on their own without treatment. If you have pain or other symptoms, treatment may be needed. Treatment is also needed if the ganglion cyst limits your movement or if it gets infected. Treatment may include:  Wearing a brace or splint on your wrist or finger.  Taking anti-inflammatory medicine.  Having fluid drained from the lump with a needle (aspiration).  Getting a steroid injected into the joint.  Having surgery to remove the ganglion cyst.  Placing a pad on your shoe or wearing shoes that will not rub against the cyst if it is on your foot. Follow these instructions at home:  Do not press on the ganglion cyst, poke it with a needle, or hit it.  Take over-the-counter and prescription medicines only as told by your health care provider.  If you have   a brace or splint: ? Wear it as told by your health care provider. ? Remove it as told by your health care provider. Ask if you need to remove it when you take a shower or a bath.  Watch your ganglion cyst for any changes.  Keep all follow-up visits as told by your health care provider. This is important. Contact a health care provider if:  Your ganglion cyst becomes larger or more painful.  You have pus coming from the lump.  You have weakness or numbness in the affected area.  You have a fever or chills. Get help right away if:  You have a fever and have any  of these in the cyst area: ? Increased redness. ? Red streaks. ? Swelling. Summary  A ganglion cyst is a non-cancerous, fluid-filled lump that occurs near a joint or tendon.  Ganglion cysts most often develop in the hand or wrist, but they can also develop in the shoulder, elbow, hip, knee, ankle, or foot.  Ganglion cysts often go away on their own without treatment. This information is not intended to replace advice given to you by your health care provider. Make sure you discuss any questions you have with your health care provider. Document Released: 07/28/2000 Document Revised: 03/30/2017 Document Reviewed: 03/30/2017 Elsevier Interactive Patient Education  2019 [employer]vier Inc[employer]  

## 2018-10-16 NOTE — Progress Notes (Signed)
Established Patient Office Visit  Subjective:  Patient ID: Tanner Riner., male    DOB: Jul 25, 1951  Age: 68 y.o. MRN: [id number]  CC:  Chief Complaint  Patient presents with  . Mass    pt has a lump on the palm of his left hand that he is concerned about.     HPI Tanner Webb. presents for   This is a patient reports of a week history of pain in the left hand after golfing outing.  He states that he had changed his grip on his golf club and was working on his handicap and hit numerous balls but noted that he started to have tenderness in the middle of the palm.  He states that he did not take anything for it and now it feels like quite a lump.  This is the first time he is ever had anything like this.  He is able to move his fingers reports that after the first day of golfing he had swelling in his hand.  This only affected the left hand and not the right.  Past Medical History:  Diagnosis Date  . Anxiety   . Atypical chest pain    a. 04/2015 Lexiscan MV: Ef 56%, no ischemia/infarct.  . Blood transfusion without reported diagnosis   . Chronic pain in left shoulder   . Hepatitis C infection, cured    resolved after treatment wiht Harvoni  . Hyperlipidemia   . Hypertension   . RBBB    a. 04/2015 - first noted.    Past Surgical History:  Procedure Laterality Date  . CHOLECYSTECTOMY    . FRACTURE SURGERY    . HERNIA REPAIR    . LAPAROTOMY N/A 06/10/2014   Procedure: EXPLORATORY LAPAROTOMY, ventral hernia repair;  Surgeon: Donnie Mesa, MD;  Location: [facility];  Service: General;  Laterality: N/A;  . SPLENECTOMY    . VENTRAL HERNIA REPAIR  06/10/2014    Family History  Problem Relation Age of Onset  . Mental illness Mother   . Alzheimer's disease Mother   . Heart disease Father   . Hypertension Father     Social History   Socioeconomic History  . Marital status: Single    Spouse name: Not on file  . Number of children: Not on file  . Years of education:  Not on file  . Highest education level: Not on file  Occupational History  . Not on file  Social Needs  . Financial resource strain: Not on file  . Food insecurity:    Worry: Not on file    Inability: Not on file  . Transportation needs:    Medical: Not on file    Non-medical: Not on file  Tobacco Use  . Smoking status: Former Smoker    Last attempt to quit: 04/30/2014    Years since quitting: 4.4  . Smokeless tobacco: Never Used  Substance and Sexual Activity  . Alcohol use: No    Alcohol/week: 0.0 standard drinks  . Drug use: No  . Sexual activity: Yes  Lifestyle  . Physical activity:    Days per week: Not on file    Minutes per session: Not on file  . Stress: Not on file  Relationships  . Social connections:    Talks on phone: Not on file    Gets together: Not on file    Attends religious service: Not on file    Active member of club or organization: Not on file  Attends meetings of clubs or organizations: Not on file    Relationship status: Not on file  . Intimate partner violence:    Fear of current or ex partner: Not on file    Emotionally abused: Not on file    Physically abused: Not on file    Forced sexual activity: Not on file  Other Topics Concern  . Not on file  Social History Narrative  . Not on file    Outpatient Medications Prior to Visit  Medication Sig Dispense Refill  . ALPRAZolam (XANAX) 1 MG tablet Take 1 tablet (1 mg total) by mouth at bedtime as needed and may repeat dose one time if needed for sleep. 60 tablet 5  . amLODipine (NORVASC) 5 MG tablet Take 1 tablet (5 mg total) by mouth daily. 90 tablet 3  . aspirin EC 81 MG EC tablet Take 1 tablet (81 mg total) by mouth daily.    Marland Kitchen Co-Enzyme Q-10 100 MG CAPS Take by mouth.    . doxepin (SINEQUAN) 25 MG capsule TAKE 1 CAPSULE (25 MG TOTAL) BY MOUTH AT BEDTIME. 90 capsule 2  . magnesium 30 MG tablet Take 30 mg by mouth 2 (two) times daily.    . Multiple Vitamins-Minerals (MULTIVITAMIN WITH  MINERALS) tablet Take 1 tablet by mouth daily.     No facility-administered medications prior to visit.     Allergies  Allergen Reactions  . Penicillins Swelling and Other (See Comments)    "leshions in my mouth"  . Sulfa Antibiotics Swelling    ROS Review of Systems    Objective:    Physical Exam  BP 118/76   Pulse 68   Temp 98 F (36.7 C) (Oral)   Resp 16   Ht 6\' 3"  (1.905 m)   Wt 202 lb (91.6 kg)   SpO2 98%   BMI 25.25 kg/m  Wt Readings from Last 3 Encounters:  10/16/18 202 lb (91.6 kg)  08/26/18 209 lb 3.2 oz (94.9 kg)  07/02/18 203 lb (92.1 kg)   Physical Exam  Constitutional: Oriented to person, place, and time. Appears well-developed and well-nourished.  HENT:  Head: Normocephalic and atraumatic.  Eyes: Conjunctivae and EOM are normal.  Cardiovascular: Normal rate, regular rhythm, normal heart sounds and intact distal pulses.  No murmur heard. Pulmonary/Chest: Effort normal and breath sounds normal. No stridor. No respiratory distress. Has no wheezes.  Neurological: Is alert and oriented to person, place, and time.  Skin: Skin is warm. Capillary refill takes less than 2 seconds.  Psychiatric: Has a normal mood and affect. Behavior is normal. Judgment and thought content normal.    Left hand with palpable well circumscribed lesion over the flexor tendon of the middle finger No fluctuance or erythema No trigger point    COMPARISON:  None.  FINDINGS: No acute fracture deformity or dislocation. No destructive bony lesions. No advanced arthropathy. Punctate radiopaque densities projecting within first distal phalanx palmar soft tissues. Rounded calcification projecting in dorsum of wrist may be projectional.  IMPRESSION: No acute osseous process.  Punctate foreign bodies projecting burst distal phalanx soft tissues.   Electronically Signed   By: Elon Alas M.D.   On: 10/16/2018 16:25      There are no preventive care reminders  to display for this patient.  There are no preventive care reminders to display for this patient.     Assessment & Plan:   Problem List Items Addressed This Visit    None    Visit Diagnoses  Left hand pain    -  Primary   Relevant Orders   DG Hand 2 View Left   Ambulatory referral to Hand Surgery   Ganglion cyst of flexor tendon sheath of finger of left hand       Relevant Orders   DG Hand 2 View Left   Ambulatory referral to Hand Surgery     Advised pt to take tylenol arthritis prn To follow up with hand surgery Checked xray to evaluate for underlying inflammatory cause  No orders of the defined types were placed in this encounter.   Follow-up: No follow-ups on file.    Forrest Moron, MD

## 2018-10-23 ENCOUNTER — Ambulatory Visit (INDEPENDENT_AMBULATORY_CARE_PROVIDER_SITE_OTHER): Payer: Medicare Other | Admitting: Physician Assistant

## 2018-10-25 ENCOUNTER — Ambulatory Visit (INDEPENDENT_AMBULATORY_CARE_PROVIDER_SITE_OTHER): Payer: Medicare Other | Admitting: Physician Assistant

## 2018-11-26 ENCOUNTER — Ambulatory Visit: Payer: Self-pay

## 2018-11-26 NOTE — Telephone Encounter (Signed)
Patient called and says he had a sore throat on Sunday, it went away. He says now he has a cough that started on yesterday, a dry cough about 2 times/day. He says nothing comes up, he's not running a fever. He says he doesn't have any other symptoms, no body aches, no chills. He denies travel or exposure to anyone who has traveled or with COVID-19. He says he's been in the house for the most part, goes out about every 3 days to the store and takes precautions. He denies SOB, wheezing. I asked him to hold, I called the office and spoke to [place], [facility] who asked to speak to the patient, I connected the call successfully.  Answer Assessment - Initial Assessment Questions 1. ONSET: "When did the cough begin?"      Yesterday  2. SEVERITY: "How bad is the cough today?"      Not bad 3. RESPIRATORY DISTRESS: "Describe your breathing."      No 4. FEVER: "Do you have a fever?" If so, ask: "What is your temperature, how was it measured, and when did it start?"     No 5. HEMOPTYSIS: "Are you coughing up any blood?" If so ask: "How much?" (flecks, streaks, tablespoons, etc.)     No 6. TREATMENT: "What have you done so far to treat the cough?" (e.g., meds, fluids, humidifier)     Zinc, Vitamin C 7. CARDIAC HISTORY: "Do you have any history of heart disease?" (e.g., heart attack, congestive heart failure)      No 8. LUNG HISTORY: "Do you have any history of lung disease?"  (e.g., pulmonary embolus, asthma, emphysema)     Chronic bronchitis 9. PE RISK FACTORS: "Do you have a history of blood clots?" (or: recent major surgery, recent prolonged travel, bedridden)     No 10. OTHER SYMPTOMS: "Do you have any other symptoms? (e.g., runny nose, wheezing, chest pain)      Had a sore throat on Sunday, but it's gone 11. PREGNANCY: "Is there any chance you are pregnant?" "When was your last menstrual period?"      N/A 12. TRAVEL: "Have you traveled out of the country in the last month?" (e.g., travel history,  exposures)      No  Protocols used: COUGH - ACUTE NON-PRODUCTIVE-A-AH

## 2018-11-27 ENCOUNTER — Telehealth (INDEPENDENT_AMBULATORY_CARE_PROVIDER_SITE_OTHER): Payer: Medicare Other | Admitting: Family Medicine

## 2018-11-27 ENCOUNTER — Other Ambulatory Visit: Payer: Self-pay

## 2018-11-27 DIAGNOSIS — J029 Acute pharyngitis, unspecified: Secondary | ICD-10-CM
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
## 2018-11-27 NOTE — Progress Notes (Signed)
Pt states he has been having sore throat, cough and chest congestion x 1 week and was concerned. Pt states he has not had any fever,shob,or chest pain. Pt states he just would like to follow-up with a provider and make sure he don't have Covid-19.

## 2018-12-06 ENCOUNTER — Other Ambulatory Visit: Payer: Self-pay

## 2018-12-13 ENCOUNTER — Other Ambulatory Visit: Payer: Self-pay

## 2018-12-13 DIAGNOSIS — I10 Essential (primary) hypertension: Secondary | ICD-10-CM

## 2018-12-17 ENCOUNTER — Other Ambulatory Visit: Payer: Self-pay

## 2018-12-17 ENCOUNTER — Telehealth (INDEPENDENT_AMBULATORY_CARE_PROVIDER_SITE_OTHER): Payer: Medicare Other | Admitting: Family Medicine

## 2018-12-17 VITALS — BP 118/76 | Wt 205.0 lb

## 2018-12-17 DIAGNOSIS — E782 Mixed hyperlipidemia: Secondary | ICD-10-CM | POA: Diagnosis not present

## 2018-12-17 DIAGNOSIS — R350 Frequency of micturition: Secondary | ICD-10-CM | POA: Diagnosis not present

## 2018-12-17 DIAGNOSIS — F5101 Primary insomnia: Secondary | ICD-10-CM

## 2018-12-17 DIAGNOSIS — I10 Essential (primary) hypertension: Secondary | ICD-10-CM

## 2018-12-17 DIAGNOSIS — R399 Unspecified symptoms and signs involving the genitourinary system: Secondary | ICD-10-CM

## 2018-12-17 DIAGNOSIS — Z125 Encounter for screening for malignant neoplasm of prostate: Secondary | ICD-10-CM

## 2018-12-17 DIAGNOSIS — G4700 Insomnia, unspecified: Secondary | ICD-10-CM

## 2018-12-17 MED ORDER — ALPRAZOLAM 1 MG PO TABS: 1.0000 mg | ORAL_TABLET | Freq: Every evening | ORAL | 1 refills | Status: AC | PRN

## 2018-12-17 MED ORDER — DOXEPIN HCL 25 MG PO CAPS: 25.0000 mg | ORAL_CAPSULE | Freq: Every day | ORAL | 2 refills | Status: AC

## 2018-12-17 NOTE — Progress Notes (Signed)
CC: Med check(former Tanner Webb pt).  Pt requesting medication refills on doxepin and xanax.  No other concerns today.  No travel outside the [place] or [place] in the past 3 weeks.

## 2018-12-17 NOTE — Patient Instructions (Addendum)
If you have lab work done today you will be contacted with your lab results within the next 2 weeks.  If you have not heard from [place] then please contact us. The fastest way to get your results is to register for My Chart.   IF you received an x-ray today, you will receive an invoice from [facility]. Please contact [facility] at [phone] with questions or concerns regarding your invoice.   IF you received labwork today, you will receive an invoice from [facility]. Please contact [facility] at [phone] with questions or concerns regarding your invoice.   Our billing staff will not be able to assist you with questions regarding bills from these companies.  You will be contacted with the lab results as soon as they are available. The fastest way to get your results is to activate your My Chart account. Instructions are located on the last page of this paperwork. If you have not heard from [place] regarding the results in 2 weeks, please contact this office.     Overactive Bladder, Adult  Overactive bladder refers to a condition in which a person has a sudden need to pass urine. The person may leak urine if he or she cannot get to the bathroom fast enough (urinary incontinence). A person with this condition may also wake up several times in the night to go to the bathroom. Overactive bladder is associated with poor nerve signals between your bladder and your brain. Your bladder may get the signal to empty before it is full. You may also have very sensitive muscles that make your bladder squeeze too soon. These symptoms might interfere with daily work or social activities. What are the causes? This condition may be associated with or caused by:  Urinary tract infection.  Infection of nearby tissues, such as the prostate.  Prostate enlargement.  Surgery on the uterus or urethra.  Bladder stones, inflammation, or tumors.  Drinking too much caffeine or  alcohol.  Certain medicines, especially medicines that get rid of extra fluid in the body (diuretics).  Muscle or nerve weakness, especially from: ? A spinal cord injury. ? Stroke. ? Multiple sclerosis. ? Parkinson's disease.  Diabetes.  Constipation. What increases the risk? You may be at greater risk for overactive bladder if you:  Are an older adult.  Smoke.  Are going through menopause.  Have prostate problems.  Have a neurological disease, such as stroke, dementia, Parkinson's disease, or multiple sclerosis (MS).  Eat or drink things that irritate the bladder. These include alcohol, spicy food, and caffeine.  Are overweight or obese. What are the signs or symptoms? Symptoms of this condition include:  Sudden, strong urge to urinate.  Leaking urine.  Urinating 8 or more times a day.  Waking up to urinate 2 or more times a night. How is this diagnosed? Your health care provider may suspect overactive bladder based on your symptoms. He or she will diagnose this condition by:  A physical exam and medical history.  Blood or urine tests. You might need bladder or urine tests to help determine what is causing your overactive bladder. You might also need to see a health care provider who specializes in urinary tract problems (urologist). How is this treated? Treatment for overactive bladder depends on the cause of your condition and whether it is mild or severe. You can also make lifestyle changes at home. Options include:  Bladder training. This may include: ? Learning to control the urge to urinate  by following a schedule that directs you to urinate at regular intervals (timed voiding). ? Doing Kegel exercises to strengthen your pelvic floor muscles, which support your bladder. Toning these muscles can help you control urination, even if your bladder muscles are overactive.  Special devices. This may include: ? Biofeedback, which uses sensors to help you become  aware of your body's signals. ? Electrical stimulation, which uses electrodes placed inside the body (implanted) or outside the body. These electrodes send gentle pulses of electricity to strengthen the nerves or muscles that control the bladder. ? Women may use a plastic device that fits into the vagina and supports the bladder (pessary).  Medicines. ? Antibiotics to treat bladder infection. ? Antispasmodics to stop the bladder from releasing urine at the wrong time. ? Tricyclic antidepressants to relax bladder muscles. ? Injections of botulinum toxin type A directly into the bladder tissue to relax bladder muscles.  Lifestyle changes. This may include: ? Weight loss. Talk to your health care provider about weight loss methods that would work best for you. ? Diet changes. This may include reducing how much alcohol and caffeine you consume, or drinking fluids at different times of the day. ? Not smoking. Do not use any products that contain nicotine or tobacco, such as cigarettes and e-cigarettes. If you need help quitting, ask your health care provider.  Surgery. ? A device may be implanted to help manage the nerve signals that control urination. ? An electrode may be implanted to stimulate electrical signals in the bladder. ? A procedure may be done to change the shape of the bladder. This is done only in very severe cases. Follow these instructions at home: Lifestyle  Make any diet or lifestyle changes that are recommended by your health care provider. These may include: ? Drinking less fluid or drinking fluids at different times of the day. ? Cutting down on caffeine or alcohol. ? Doing Kegel exercises. ? Losing weight if needed. ? Eating a healthy and balanced diet to prevent constipation. This may include:  Eating foods that are high in fiber, such as fresh fruits and vegetables, whole grains, and beans.  Limiting foods that are high in fat and processed sugars, such as fried and  sweet foods. General instructions  Take over-the-counter and prescription medicines only as told by your health care provider.  If you were prescribed an antibiotic medicine, take it as told by your health care provider. Do not stop taking the antibiotic even if you start to feel better.  Use any implants or pessary as told by your health care provider.  If needed, wear pads to absorb urine leakage.  Keep a journal or log to track how much and when you drink and when you feel the need to urinate. This will help your health care provider monitor your condition.  Keep all follow-up visits as told by your health care provider. This is important. Contact a health care provider if:  You have a fever.  Your symptoms do not get better with treatment.  Your pain and discomfort get worse.  You have more frequent urges to urinate. Get help right away if:  You are not able to control your bladder. Summary  Overactive bladder refers to a condition in which a person has a sudden need to pass urine.  Several conditions may lead to an overactive bladder.  Treatment for overactive bladder depends on the cause and severity of your condition.  Follow your health care provider's instructions about  lifestyle changes, doing Kegel exercises, keeping a journal, and taking medicines. This information is not intended to replace advice given to you by your health care provider. Make sure you discuss any questions you have with your health care provider. Document Released: 05/27/2009 Document Revised: 08/16/2017 Document Reviewed: 08/16/2017 Elsevier Interactive Patient Education  2019 [employer].

## 2018-12-17 NOTE — Progress Notes (Signed)
Telemedicine Encounter- SOAP NOTE Established Patient  This telephone encounter was conducted with the patient's (or proxy's) verbal consent via audio telecommunications: yes/no: Yes Patient was instructed to have this encounter in a suitably private space; and to only have persons present to whom they give permission to participate. In addition, patient identity was confirmed by use of name plus two identifiers (DOB and address).  I discussed the limitations, risks, security and privacy concerns of performing an evaluation and management service by telephone and the availability of in person appointments. I also discussed with the patient that there may be a patient responsible charge related to this service. The patient expressed understanding and agreed to proceed.  I spent a total of TIME; 0 MIN TO 60 MIN: 25 minutes talking with the patient or their proxy.  CC:  insomnia Subjective   Tanner Webb. is a 68 y.o. established patient. 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 He takes care of grandkids 4-5 of them  He also has a fiance He feels stressed He does not drink alcohol or smoke No depression  Depression screen [facility] 2/9 12/17/2018 11/27/2018 10/16/2018 08/26/2018 07/02/2018  Decreased Interest 0 0 0 0 1  Down, Depressed, Hopeless 0 0 0 0 1  PHQ - 2 Score 0 0 0 0 2  Altered sleeping  - - - - 3  Tired, decreased energy - - - - 2  Change in appetite - - - - 0  Feeling bad or failure about yourself  - - - - 0  Trouble concentrating - - - - 0  Moving slowly or fidgety/restless - - - - 0  Suicidal thoughts - - - - 0  PHQ-9 Score - - - - 7  Difficult doing work/chores - - - - -  Some recent data might be hidden    Patient Active Problem List   Diagnosis Date Noted  . Acute pharyngitis 11/27/2018  . Insomnia 08/03/2017  . Anxiety 12/19/2015  . Essential hypertension 05/08/2015  . Hyperlipidemia 05/08/2015  . Atypical chest pain 05/07/2015  . Incarcerated ventral hernia 06/10/2014  . Chronic pain in left shoulder 03/30/2012  . Anemia 03/30/2012    Past Medical History:  Diagnosis Date  . Anxiety   . Atypical chest pain    a. 04/2015 Lexiscan MV: Ef 56%, no ischemia/infarct.  . Blood transfusion without reported diagnosis   . Chronic pain in left shoulder   . Hepatitis C infection, cured    resolved after treatment wiht Harvoni  . Hyperlipidemia   . Hypertension   . RBBB    a. 04/2015 - first noted.    Current Outpatient Medications  Medication Sig Dispense Refill  . ALPRAZolam (XANAX) 1 MG tablet Take 1 tablet (1 mg total) by mouth at bedtime as needed  and may repeat dose one time if needed for sleep. 60 tablet 1  . amLODipine (NORVASC) 5 MG tablet Take 1 tablet (5 mg total) by mouth daily. 90 tablet 3  . aspirin EC 81 MG EC tablet Take 1 tablet (81 mg total) by mouth daily.    Marland Kitchen doxepin (SINEQUAN) 25 MG capsule Take 1 capsule (25 mg total) by mouth at bedtime. 90 capsule 2  . Multiple Vitamins-Minerals (MULTIVITAMIN WITH MINERALS) tablet Take 1 tablet by mouth daily.    Marland Kitchen Co-Enzyme Q-10 100 MG CAPS Take by mouth.    . magnesium 30 MG tablet Take 30 mg by mouth 2 (two) times daily.     No current facility-administered medications for this visit.     Allergies  Allergen Reactions  . Penicillins Swelling and Other (See Comments)    "leshions in my  mouth"  . Sulfa Antibiotics Swelling    Social History   Socioeconomic History  . Marital status: Single    Spouse name: Not on file  . Number of children: Not on file  . Years of education: Not on file  . Highest education level: Not on file  Occupational History  . Not on file  Social Needs  . Financial resource strain: Not on file  . Food insecurity:    Worry: Not on file    Inability: Not on file  . Transportation needs:    Medical: Not on file    Non-medical: Not on file  Tobacco Use  . Smoking status: Former Smoker    Last attempt to quit: 04/30/2014    Years since quitting: 4.6  . Smokeless tobacco: Never Used  Substance and Sexual Activity  . Alcohol use: No    Alcohol/week: 0.0 standard drinks  . Drug use: No  . Sexual activity: Yes  Lifestyle  . Physical activity:    Days per week: Not on file    Minutes per session: Not on file  . Stress: Not on file  Relationships  . Social connections:    Talks on phone: Not on file    Gets together: Not on file    Attends religious service: Not on file    Active member of club or organization: Not on file    Attends meetings of clubs or organizations: Not on file    Relationship status: Not on file  . Intimate partner violence:    Fear of current or ex partner: Not on file    Emotionally abused: Not on file    Physically abused: Not on file    Forced sexual activity: Not on file  Other Topics Concern  . Not on file  Social History Narrative  . Not on file    ROS  Review of Systems  Constitutional: Negative for activity change, appetite change, chills and fever.  HENT: Negative for congestion, nosebleeds, trouble swallowing and voice change.   Respiratory: Negative for cough, shortness of breath and wheezing.   Gastrointestinal: Negative for diarrhea, nausea and vomiting.  Genitourinary: Negative for difficulty urinating, dysuria, flank pain and hematuria. See hpi Musculoskeletal: Negative for back pain,  joint swelling and neck pain.  Neurological: Negative for dizziness, speech difficulty, light-headedness and numbness.  See HPI. All other review of systems negative.    Objective   Vitals as reported by the patient: Today's Vitals   12/17/18 1332  BP: 118/76  Weight: 205 lb (93 kg)    Diagnoses and all orders for this visit:  Essential hypertension -  bp at goal, DASH diet and exercise  Mixed hyperlipidemia- continue current meds   Primary insomnia - will refer for sleep evaluation due to insomnia and nocturia  -     Ambulatory referral to Sleep Studies -     TSH; Future  Insomnia, unspecified type- continue current medication  -     doxepin (SINEQUAN) 25 MG capsule; Take 1 capsule (25 mg total) by mouth at bedtime.  Frequent urination- discussed causes of frequent urination  -     PSA; Future -     Hemoglobin A1c; Future -     TSH; Future -     Urine Culture; Future  Lower urinary tract symptoms (LUTS) -     PSA; Future  Screening for malignant neoplasm of prostate- discussed risk factors  -     PSA; Future  Other orders -     ALPRAZolam (XANAX) 1 MG tablet; Take 1 tablet (1 mg total) by mouth at bedtime as needed and may repeat dose one time if needed for sleep.     I discussed the assessment and treatment plan with the patient. The patient was provided an opportunity to ask questions and all were answered. The patient agreed with the plan and demonstrated an understanding of the instructions.   The patient was advised to call back or seek an in-person evaluation if the symptoms worsen or if the condition fails to improve as anticipated.  I provided 25 minutes of non-face-to-face time during this encounter.  Forrest Moron, MD  Primary Care at [facility]

## 2019-02-11 DIAGNOSIS — F17290 Nicotine dependence, other tobacco product, uncomplicated: Secondary | ICD-10-CM | POA: Diagnosis not present

## 2019-02-11 DIAGNOSIS — R609 Edema, unspecified: Secondary | ICD-10-CM | POA: Diagnosis not present

## 2019-02-11 DIAGNOSIS — R52 Pain, unspecified: Secondary | ICD-10-CM | POA: Diagnosis not present

## 2019-02-11 DIAGNOSIS — S8251XA Displaced fracture of medial malleolus of right tibia, initial encounter for closed fracture: Secondary | ICD-10-CM | POA: Diagnosis not present

## 2019-02-11 DIAGNOSIS — S8254XA Nondisplaced fracture of medial malleolus of right tibia, initial encounter for closed fracture: Secondary | ICD-10-CM | POA: Diagnosis not present

## 2019-02-11 DIAGNOSIS — I10 Essential (primary) hypertension: Secondary | ICD-10-CM | POA: Diagnosis not present

## 2019-02-11 DIAGNOSIS — S82891A Other fracture of right lower leg, initial encounter for closed fracture: Secondary | ICD-10-CM | POA: Diagnosis not present

## 2019-02-11 DIAGNOSIS — W19XXXA Unspecified fall, initial encounter: Secondary | ICD-10-CM | POA: Diagnosis not present

## 2019-02-11 DIAGNOSIS — Z79899 Other long term (current) drug therapy: Secondary | ICD-10-CM | POA: Diagnosis not present

## 2019-02-13 DIAGNOSIS — M25571 Pain in right ankle and joints of right foot: Secondary | ICD-10-CM | POA: Diagnosis not present

## 2019-02-26 DIAGNOSIS — S8251XA Displaced fracture of medial malleolus of right tibia, initial encounter for closed fracture: Secondary | ICD-10-CM | POA: Diagnosis not present

## 2019-03-04 DIAGNOSIS — S8251XA Displaced fracture of medial malleolus of right tibia, initial encounter for closed fracture: Secondary | ICD-10-CM | POA: Diagnosis not present

## 2019-03-04 DIAGNOSIS — S96891A Other specified injury of other specified muscles and tendons at ankle and foot level, right foot, initial encounter: Secondary | ICD-10-CM | POA: Diagnosis not present

## 2019-03-04 DIAGNOSIS — Y999 Unspecified external cause status: Secondary | ICD-10-CM | POA: Diagnosis not present

## 2019-03-04 DIAGNOSIS — X58XXXA Exposure to other specified factors, initial encounter: Secondary | ICD-10-CM | POA: Diagnosis not present

## 2019-03-11 ENCOUNTER — Institutional Professional Consult (permissible substitution): Payer: Medicare Other | Admitting: Neurology

## 2019-03-19 DIAGNOSIS — S8251XA Displaced fracture of medial malleolus of right tibia, initial encounter for closed fracture: Secondary | ICD-10-CM | POA: Diagnosis not present

## 2019-03-19 DIAGNOSIS — Z5189 Encounter for other specified aftercare: Secondary | ICD-10-CM | POA: Diagnosis not present

## 2019-04-18 DIAGNOSIS — S8251XA Displaced fracture of medial malleolus of right tibia, initial encounter for closed fracture: Secondary | ICD-10-CM | POA: Diagnosis not present

## 2019-04-18 DIAGNOSIS — Z5189 Encounter for other specified aftercare: Secondary | ICD-10-CM | POA: Diagnosis not present

## 2019-04-18 DIAGNOSIS — M25571 Pain in right ankle and joints of right foot: Secondary | ICD-10-CM | POA: Diagnosis not present

## 2019-04-22 DIAGNOSIS — Z23 Encounter for immunization: Secondary | ICD-10-CM | POA: Diagnosis not present

## 2019-05-19 DIAGNOSIS — M25571 Pain in right ankle and joints of right foot: Secondary | ICD-10-CM | POA: Diagnosis not present

## 2019-05-19 DIAGNOSIS — S8251XA Displaced fracture of medial malleolus of right tibia, initial encounter for closed fracture: Secondary | ICD-10-CM | POA: Diagnosis not present

## 2019-05-19 DIAGNOSIS — Z4889 Encounter for other specified surgical aftercare: Secondary | ICD-10-CM | POA: Diagnosis not present

## 2019-06-16 DIAGNOSIS — Z09 Encounter for follow-up examination after completed treatment for conditions other than malignant neoplasm: Secondary | ICD-10-CM | POA: Diagnosis not present

## 2019-06-16 DIAGNOSIS — S8251XA Displaced fracture of medial malleolus of right tibia, initial encounter for closed fracture: Secondary | ICD-10-CM | POA: Diagnosis not present

## 2019-06-16 DIAGNOSIS — M25571 Pain in right ankle and joints of right foot: Secondary | ICD-10-CM | POA: Diagnosis not present

## 2019-09-19 DIAGNOSIS — D649 Anemia, unspecified: Secondary | ICD-10-CM | POA: Diagnosis not present

## 2019-09-19 DIAGNOSIS — G4700 Insomnia, unspecified: Secondary | ICD-10-CM | POA: Diagnosis not present

## 2019-09-19 DIAGNOSIS — I10 Essential (primary) hypertension: Secondary | ICD-10-CM | POA: Diagnosis not present

## 2019-09-19 DIAGNOSIS — E785 Hyperlipidemia, unspecified: Secondary | ICD-10-CM | POA: Diagnosis not present

## 2019-11-21 DIAGNOSIS — Z87891 Personal history of nicotine dependence: Secondary | ICD-10-CM | POA: Diagnosis not present

## 2019-11-21 DIAGNOSIS — E785 Hyperlipidemia, unspecified: Secondary | ICD-10-CM | POA: Diagnosis not present

## 2019-11-21 DIAGNOSIS — G4700 Insomnia, unspecified: Secondary | ICD-10-CM | POA: Diagnosis not present

## 2019-11-21 DIAGNOSIS — H6123 Impacted cerumen, bilateral: Secondary | ICD-10-CM | POA: Diagnosis not present

## 2019-11-21 DIAGNOSIS — D649 Anemia, unspecified: Secondary | ICD-10-CM | POA: Diagnosis not present

## 2019-11-21 DIAGNOSIS — M25571 Pain in right ankle and joints of right foot: Secondary | ICD-10-CM | POA: Diagnosis not present

## 2019-11-21 DIAGNOSIS — M79673 Pain in unspecified foot: Secondary | ICD-10-CM | POA: Diagnosis not present

## 2019-11-21 DIAGNOSIS — I10 Essential (primary) hypertension: Secondary | ICD-10-CM | POA: Diagnosis not present

## 2019-12-08 ENCOUNTER — Other Ambulatory Visit: Payer: Self-pay | Admitting: Family Medicine

## 2019-12-08 DIAGNOSIS — Z87891 Personal history of nicotine dependence: Secondary | ICD-10-CM

## 2019-12-16 ENCOUNTER — Ambulatory Visit
Admission: RE | Admit: 2019-12-16 | Discharge: 2019-12-16 | Disposition: A | Discharge status: Home / Self Care | Payer: 59 | Source: Ambulatory Visit | Attending: Family Medicine | Admitting: Family Medicine

## 2019-12-16 DIAGNOSIS — Z87891 Personal history of nicotine dependence: Secondary | ICD-10-CM | POA: Diagnosis not present

## 2019-12-16 DIAGNOSIS — Z136 Encounter for screening for cardiovascular disorders: Secondary | ICD-10-CM | POA: Diagnosis not present

## 2019-12-18 DIAGNOSIS — D225 Melanocytic nevi of trunk: Secondary | ICD-10-CM | POA: Diagnosis not present

## 2019-12-18 DIAGNOSIS — L988 Other specified disorders of the skin and subcutaneous tissue: Secondary | ICD-10-CM | POA: Diagnosis not present

## 2019-12-18 DIAGNOSIS — L821 Other seborrheic keratosis: Secondary | ICD-10-CM | POA: Diagnosis not present

## 2019-12-18 DIAGNOSIS — D485 Neoplasm of uncertain behavior of skin: Secondary | ICD-10-CM | POA: Diagnosis not present

## 2019-12-19 ENCOUNTER — Other Ambulatory Visit: Payer: Self-pay | Admitting: *Deleted

## 2019-12-19 DIAGNOSIS — Z87891 Personal history of nicotine dependence: Secondary | ICD-10-CM

## 2019-12-19 DIAGNOSIS — F17210 Nicotine dependence, cigarettes, uncomplicated: Secondary | ICD-10-CM

## 2020-01-15 DIAGNOSIS — D485 Neoplasm of uncertain behavior of skin: Secondary | ICD-10-CM | POA: Diagnosis not present

## 2020-01-20 ENCOUNTER — Telehealth: Payer: Self-pay | Admitting: Acute Care

## 2020-01-20 NOTE — Telephone Encounter (Signed)
Patient called, appointment cancelled.

## 2020-01-21 ENCOUNTER — Encounter: Payer: Medicare Other | Admitting: Acute Care

## 2020-01-21 ENCOUNTER — Other Ambulatory Visit: Payer: Self-pay

## 2020-01-21 ENCOUNTER — Ambulatory Visit (INDEPENDENT_AMBULATORY_CARE_PROVIDER_SITE_OTHER): Payer: Medicare Other | Admitting: Acute Care

## 2020-01-21 ENCOUNTER — Encounter: Payer: Self-pay | Admitting: Acute Care

## 2020-01-21 ENCOUNTER — Ambulatory Visit (HOSPITAL_COMMUNITY)
Admission: RE | Admit: 2020-01-21 | Discharge: 2020-01-21 | Disposition: A | Discharge status: Home / Self Care | Payer: Medicare Other | Source: Ambulatory Visit | Attending: Acute Care | Admitting: Acute Care

## 2020-01-21 DIAGNOSIS — F17210 Nicotine dependence, cigarettes, uncomplicated: Secondary | ICD-10-CM | POA: Diagnosis not present

## 2020-01-21 DIAGNOSIS — Z122 Encounter for screening for malignant neoplasm of respiratory organs: Secondary | ICD-10-CM | POA: Insufficient documentation

## 2020-01-21 DIAGNOSIS — Z87891 Personal history of nicotine dependence: Secondary | ICD-10-CM

## 2020-01-21 NOTE — Progress Notes (Signed)
Shared Decision Making Visit Lung Cancer Screening Program [phone])   Eligibility:  Age 69 y.o.  Pack Years Smoking History Calculation 31 pack year smoking history (# packs/per year x # years smoked)  Recent History of coughing up blood  no  Unexplained weight loss? no ( >Than 15 pounds within the last 6 months )  Prior History Lung / other cancer no (Diagnosis within the last 5 years already requiring surveillance chest CT Scans).  Smoking Status Current Smoker Former Smokers: Years since quit: NA  Quit Date: NA  Visit Components:  Discussion included one or more decision making aids. yes  Discussion included risk/benefits of screening. yes  Discussion included potential follow up diagnostic testing for abnormal scans. yes  Discussion included meaning and risk of over diagnosis. yes  Discussion included meaning and risk of False Positives. yes  Discussion included meaning of total radiation exposure. yes  Counseling Included:  Importance of adherence to annual lung cancer LDCT screening. yes  Impact of comorbidities on ability to participate in the program. yes  Ability and willingness to under diagnostic treatment. yes  Smoking Cessation Counseling:  Current Smokers:   Discussed importance of smoking cessation. yes  Information about tobacco cessation classes and interventions provided to patient. yes  Patient provided with "ticket" for LDCT Scan. yes  Symptomatic Patient. no  Counseling NA  Diagnosis Code: Tobacco Use Z72.0  Asymptomatic Patient yes  Counseling (Intermediate counseling: > three minutes counseling) E5277  Former Smokers:   Discussed the importance of maintaining cigarette abstinence. yes  Diagnosis Code: Personal History of Nicotine Dependence. O24.235  Information about tobacco cessation classes and interventions provided to patient. Yes  Patient provided with "ticket" for LDCT Scan. yes  Written Order for Lung Cancer  Screening with LDCT placed in Epic. Yes (CT Chest Lung Cancer Screening Low Dose W/O CM) [id number] Z12.2-Screening of respiratory organs Z87.891-Personal history of nicotine dependence  I have spent 25 minutes of face to face time with Mr. Steppe discussing the risks and benefits of lung cancer screening. We viewed a power point together that explained in detail the above noted topics. We paused at intervals to allow for questions to be asked and answered to ensure understanding.We discussed that the single most powerful action that he can take to decrease his risk of developing lung cancer is to quit smoking. We discussed whether or not he is ready to commit to setting a quit date. We discussed options for tools to aid in quitting smoking including nicotine replacement therapy, non-nicotine medications, support groups, Quit Smart classes, and behavior modification. We discussed that often times setting smaller, more achievable goals, such as eliminating 1 cigarette a day for a week and then 2 cigarettes a day for a week can be helpful in slowly decreasing the number of cigarettes smoked. This allows for a sense of accomplishment as well as providing a clinical benefit. I gave him the " Be Stronger Than Your Excuses" card with contact information for community resources, classes, free nicotine replacement therapy, and access to mobile apps, text messaging, and on-line smoking cessation help. I have also given him my card and contact information in the event he needs to contact me. We discussed the time and location of the scan, and that either Doroteo Glassman RN or I will call with the results within 24-48 hours of receiving them. I have offered him  a copy of the power point we viewed  as a resource in the event they need reinforcement of  the concepts we discussed today in the office. The patient verbalized understanding of all of  the above and had no further questions upon leaving the office. They have my  contact information in the event they have any further questions.  I spent 3 minutes counseling on smoking cessation and the health risks of continued tobacco abuse.  I explained to the patient that there has been a high incidence of coronary artery disease noted on these exams. I explained that this is a non-gated exam therefore degree or severity cannot be determined. This patient is not on statin therapy. I have asked the patient to follow-up with their PCP regarding any incidental finding of coronary artery disease and management with diet or medication as their PCP  feels is clinically indicated. The patient verbalized understanding of the above and had no further questions upon completion of the visit.      Magdalen Spatz, NP 01/21/2020 11:52 AM

## 2020-01-21 NOTE — Patient Instructions (Signed)
Thank you for participating in the [facility] Screening Program. It was our pleasure to meet you today. We will call you with the results of your scan within the next few days. Your scan will be assigned a Lung RADS category score by the physicians reading the scans.  This Lung RADS score determines follow up scanning.  See below for description of categories, and follow up screening recommendations. We will be in touch to schedule your follow up screening annually or based on recommendations of our providers. We will fax a copy of your scan results to your Primary Care Physician, or the physician who referred you to the program, to ensure they have the results. Please call the office if you have any questions or concerns regarding your scanning experience or results.  Our office number is [phone]. Please speak with Denise Phelps, RN. She is our Lung Cancer Screening RN. If she is unavailable when you call, please have the office staff send her a message. She will return your call at her earliest convenience. Remember, if your scan is normal, we will scan you annually as long as you continue to meet the criteria for the program. (Age 55-77, Current smoker or smoker who has quit within the last 15 years). If you are a smoker, remember, quitting is the single most powerful action that you can take to decrease your risk of lung cancer and other pulmonary, breathing related problems. We know quitting is hard, and we are here to help.  Please let us know if there is anything we can do to help you meet your goal of quitting. If you are a former smoker, congratulations. We are proud of you! Remain smoke free! Remember you can refer friends or family members through the number above.  We will screen them to make sure they meet criteria for the program. Thank you for helping us take better care of you by participating in Lung Screening.  Lung RADS Categories:  Lung RADS 1: no nodules  or definitely non-concerning nodules.  Recommendation is for a repeat annual scan in 12 months.  Lung RADS 2:  nodules that are non-concerning in appearance and behavior with a very low likelihood of becoming an active cancer. Recommendation is for a repeat annual scan in 12 months.  Lung RADS 3: nodules that are probably non-concerning , includes nodules with a low likelihood of becoming an active cancer.  Recommendation is for a 6-month repeat screening scan. Often noted after an upper respiratory illness. We will be in touch to make sure you have no questions, and to schedule your 6-month scan.  Lung RADS 4 A: nodules with concerning findings, recommendation is most often for a follow up scan in 3 months or additional testing based on our provider's assessment of the scan. We will be in touch to make sure you have no questions and to schedule the recommended 3 month follow up scan.  Lung RADS 4 B:  indicates findings that are concerning. We will be in touch with you to schedule additional diagnostic testing based on our provider's  assessment of the scan.   

## 2020-01-22 ENCOUNTER — Other Ambulatory Visit: Payer: Self-pay | Admitting: *Deleted

## 2020-01-22 DIAGNOSIS — Z87891 Personal history of nicotine dependence: Secondary | ICD-10-CM

## 2020-01-22 DIAGNOSIS — F17210 Nicotine dependence, cigarettes, uncomplicated: Secondary | ICD-10-CM

## 2020-01-22 NOTE — Progress Notes (Signed)
Please call patient and let them  know their  low dose Ct was read as a Lung RADS 2: nodules that are benign in appearance and behavior with a very low likelihood of becoming a clinically active cancer due to size or lack of growth. Recommendation per radiology is for a repeat LDCT in 12 months. .Please let them  know we will order and schedule their  annual screening scan for 01/2021 Please let them  know there was notation of CAD on their  scan.  Please remind the patient  that this is a non-gated exam therefore degree or severity of disease  cannot be determined. Please have them  follow up with their PCP regarding potential risk factor modification, dietary therapy or pharmacologic therapy if clinically indicated. Pt.  is  not currently on statin therapy. Please place order for annual  screening scan for  01/2021 and fax results to PCP. Thanks so much. 

## 2020-01-26 DIAGNOSIS — D485 Neoplasm of uncertain behavior of skin: Secondary | ICD-10-CM | POA: Diagnosis not present

## 2020-01-26 DIAGNOSIS — L570 Actinic keratosis: Secondary | ICD-10-CM | POA: Diagnosis not present

## 2020-01-26 DIAGNOSIS — X32XXXA Exposure to sunlight, initial encounter: Secondary | ICD-10-CM | POA: Diagnosis not present

## 2020-10-28 ENCOUNTER — Telehealth (HOSPITAL_COMMUNITY): Payer: Self-pay | Admitting: *Deleted

## 2020-10-28 NOTE — Telephone Encounter (Signed)
Received referral from Dr. Delman Cheadle to sch patient appt for therapy. Staff was not able to reach patient and [facility] with office number and hours for patient to call back and sch appt if he is still interested in scheduling appt.

## 2021-01-13 ENCOUNTER — Other Ambulatory Visit: Payer: Self-pay | Admitting: *Deleted

## 2021-01-13 DIAGNOSIS — F17210 Nicotine dependence, cigarettes, uncomplicated: Secondary | ICD-10-CM

## 2021-01-13 DIAGNOSIS — Z87891 Personal history of nicotine dependence: Secondary | ICD-10-CM

## 2021-02-10 ENCOUNTER — Ambulatory Visit (HOSPITAL_COMMUNITY): Payer: Medicare Other

## 2021-03-02 ENCOUNTER — Ambulatory Visit (HOSPITAL_COMMUNITY): Payer: Medicare Other

## 2021-03-11 ENCOUNTER — Ambulatory Visit: Payer: Self-pay

## 2021-03-11 NOTE — Telephone Encounter (Signed)
Patient called and says he has not had a BM in 2 weeks. He says he has taken Miralax and liquid stool comes out, but he still feels it hard at the rectum like he still needs to have a BM. He says he went to the ED in [place], [place] and the doctor told him he was constipated, but he says they did not check his rectum. He says he has a hernia to the pelvic area above his penis that sticks out when he's straining to have a BM. He says he has taken dulcolax and is waiting on that to work. Denies any other symptoms. I advised to go to the ED for no BM in 2 weeks and pain. He says he doesn't want to go back there. He says he will be calling [facility] tomorrow morning and scheduling an appointment for Sunday, he says it's on [address]. I advised to find a PCP in [facility], gave number for [facility] to call next week to schedule for a new PCP. He asked about GI, I advised he will need a PCP to do that referral. Care advice given, patient verbalized understanding.  Reason for Disposition  Last bowel movement (BM) > 4 days ago  Answer Assessment - Initial Assessment Questions 1. STOOL PATTERN OR FREQUENCY: "How often do you have a bowel movement (BM)?"  (Normal range: 3 times a day to every 3 days)  "When was your last BM?"       2- 3 times a week normally 2. STRAINING: "Do you have to strain to have a BM?"      Yes 3. RECTAL PAIN: "Does your rectum hurt when the stool comes out?" If Yes, ask: "Do you have hemorrhoids? How bad is the pain?"  (Scale 1-10; or mild, moderate, severe)     No, I have pain to the pelvic area where I have the hernia 4. STOOL COMPOSITION: "Are the stools hard?"      Not sure 5. BLOOD ON STOOLS: "Has there been any blood on the toilet tissue or on the surface of the BM?" If Yes, ask: "When was the last time?"      No 6. CHRONIC CONSTIPATION: "Is this a new problem for you?"  If no, ask: "How long have you had this problem?" (days, weeks, months)      2 weeks no  BM 7. CHANGES IN DIET OR HYDRATION: "Have there been any recent changes in your diet?" "How much fluids are you drinking on a daily basis?"  "How much have you had to drink today?"      Yes, not eating as healthy since depression 8. MEDICATIONS: "Have you been taking any new medications?" "Are you taking any narcotic pain medications?" (e.g., Vicodin, Percocet, morphine, Dilaudid)     No 9. LAXATIVES: "Have you been using any stool softeners, laxatives, or enemas?"  If yes, ask "What, how often, and when was the last time?"     Yes laxatives, no stool softener, Miralax worked a little 10. ACTIVITY:  "How much walking do you do every day?"  "Has your activity level decreased in the past week?"        Not good 11. CAUSE: "What do you think is causing the constipation?"        Scar tissue from all surgeries, diet changed and no exercise 12. OTHER SYMPTOMS: "Do you have any other symptoms?" (e.g., abdominal pain, bloating, fever, vomiting)       Abdominal pain  to pelvic area 13. MEDICAL HISTORY: "Do you have a history of hemorrhoids, rectal fissures, or rectal surgery or rectal abscess?"         No 14. PREGNANCY: "Is there any chance you are pregnant?" "When was your last menstrual period?"       N/A  Protocols used: Constipation-A-AH

## 2021-03-13 ENCOUNTER — Ambulatory Visit (HOSPITAL_COMMUNITY): Payer: Self-pay

## 2021-03-14 ENCOUNTER — Telehealth (HOSPITAL_COMMUNITY): Payer: Self-pay | Admitting: *Deleted

## 2021-03-14 NOTE — Telephone Encounter (Signed)
Patient called office back wanting to sch appt. Patient changed his mind about sch due to thinking the person he would be schedule with fills medications and office don't have any provider at this time for med management.

## 2021-03-14 NOTE — Telephone Encounter (Signed)
Referral from [facility] was received. Staff called and LMOM with office number on voicemail.   Office did not get a call back and called patient back to see if he was still interested. Staff spoke with patient and he stated that he was not interested at the time and will call back when he it.   Staff informed patient when he is ready to get his doctor office to submit another referral and patient verbalized understanding and agreed.

## 2021-04-08 ENCOUNTER — Emergency Department (HOSPITAL_COMMUNITY): Payer: Medicare Other

## 2021-04-08 ENCOUNTER — Encounter (HOSPITAL_COMMUNITY): Payer: Self-pay

## 2021-04-08 ENCOUNTER — Emergency Department (HOSPITAL_COMMUNITY)
Admission: EM | Admit: 2021-04-08 | Discharge: 2021-04-08 | Disposition: A | Discharge status: Home / Self Care | Payer: Medicare Other | Attending: Emergency Medicine | Admitting: Emergency Medicine

## 2021-04-08 ENCOUNTER — Other Ambulatory Visit: Payer: Self-pay

## 2021-04-08 DIAGNOSIS — R1084 Generalized abdominal pain: Secondary | ICD-10-CM | POA: Diagnosis not present

## 2021-04-08 DIAGNOSIS — Z7982 Long term (current) use of aspirin: Secondary | ICD-10-CM | POA: Insufficient documentation

## 2021-04-08 DIAGNOSIS — R1031 Right lower quadrant pain: Secondary | ICD-10-CM | POA: Diagnosis not present

## 2021-04-08 DIAGNOSIS — F17210 Nicotine dependence, cigarettes, uncomplicated: Secondary | ICD-10-CM | POA: Insufficient documentation

## 2021-04-08 DIAGNOSIS — Z79899 Other long term (current) drug therapy: Secondary | ICD-10-CM | POA: Insufficient documentation

## 2021-04-08 DIAGNOSIS — I10 Essential (primary) hypertension: Secondary | ICD-10-CM | POA: Diagnosis not present

## 2021-04-08 LAB — CBC WITH DIFFERENTIAL/PLATELET
Abs Immature Granulocytes: 0.07 10*3/uL (ref 0.00–0.07)
Basophils Absolute: 0.1 10*3/uL (ref 0.0–0.1)
Basophils Relative: 0 %
Eosinophils Absolute: 0 10*3/uL (ref 0.0–0.5)
Eosinophils Relative: 0 %
HCT: 42.5 % (ref 39.0–52.0)
Hemoglobin: 14.7 g/dL (ref 13.0–17.0)
Immature Granulocytes: 0 %
Lymphocytes Relative: 7 %
Lymphs Abs: 1.1 10*3/uL (ref 0.7–4.0)
MCH: 32 pg (ref 26.0–34.0)
MCHC: 34.6 g/dL (ref 30.0–36.0)
MCV: 92.4 fL (ref 80.0–100.0)
Monocytes Absolute: 0.8 10*3/uL (ref 0.1–1.0)
Monocytes Relative: 5 %
Neutro Abs: 13.8 10*3/uL — ABNORMAL HIGH (ref 1.7–7.7)
Neutrophils Relative %: 88 %
Platelets: 406 10*3/uL — ABNORMAL HIGH (ref 150–400)
RBC: 4.6 MIL/uL (ref 4.22–5.81)
RDW: 13.4 % (ref 11.5–15.5)
WBC: 15.9 10*3/uL — ABNORMAL HIGH (ref 4.0–10.5)
nRBC: 0 % (ref 0.0–0.2)

## 2021-04-08 LAB — COMPREHENSIVE METABOLIC PANEL
ALT: 27 U/L (ref 0–44)
AST: 35 U/L (ref 15–41)
Albumin: 4.6 g/dL (ref 3.5–5.0)
Alkaline Phosphatase: 93 U/L (ref 38–126)
Anion gap: 7 (ref 5–15)
BUN: 14 mg/dL (ref 8–23)
CO2: 25 mmol/L (ref 22–32)
Calcium: 8.9 mg/dL (ref 8.9–10.3)
Chloride: 95 mmol/L — ABNORMAL LOW (ref 98–111)
Creatinine, Ser: 0.69 mg/dL (ref 0.61–1.24)
GFR, Estimated: >60 mL/min (ref >60)
Glucose, Bld: 116 mg/dL — ABNORMAL HIGH (ref 70–99)
Potassium: 3.9 mmol/L (ref 3.5–5.1)
Sodium: 127 mmol/L — ABNORMAL LOW (ref 135–145)
Total Bilirubin: 0.7 mg/dL (ref 0.3–1.2)
Total Protein: 7.5 g/dL (ref 6.5–8.1)

## 2021-04-08 LAB — LIPASE, BLOOD: Lipase: 35 U/L (ref 11–51)

## 2021-04-08 MED ORDER — IOHEXOL 350 MG/ML SOLN
85.0000 mL | Freq: Once | INTRAVENOUS | Status: AC | PRN
  Administered 2021-04-08: 85 mL via INTRAVENOUS

## 2021-04-08 NOTE — ED Provider Notes (Signed)
[facility] EMERGENCY DEPARTMENT Provider Note  CSN: [id number] Arrival date & time: 04/08/21 1603    History Chief Complaint  Patient presents with   Abdominal Pain    Tanner Webb. is a 70 y.o. male with history of incarcerated ventral hernia requiring emergent surgery in 2015 here for evaluation of suspected R inguinal hernia. He reports over the last several days he has been straining due to constipation and developed a bulge in his R inguinal area. He reports he had a hernia repaired there about 40 years ago. He was in so much pain he called EMS and was given '10mg'$  of Morphine and 52mg Fentanyl en route. Prior to arrival, the bulge has gone away. He continues to complain of diffuse cramping abdominal pain. No vomiting. No fever. [facility] had [facility] but no advanced imaging. He has been taking stool softeners but still reports straining for BM.    Past Medical History:  Diagnosis Date   Anxiety    Atypical chest pain    a. 04/2015 Lexiscan MV: Ef 56%, no ischemia/infarct.   Blood transfusion without reported diagnosis    Chronic pain in left shoulder    Hepatitis C infection, cured    resolved after treatment wiht Harv[facility]   Hyperlipidemia    Hypertension    RBBB    a. 04/2015 - first noted.    Past Surgical History:  Procedure Laterality Date   CHOLECYSTECTOMY     FRACTURE SURGERY     HERNIA REPAIR     LAPAROTOMY N/A 06/10/2014   Procedure: EXPLORATORY LAPAROTOMY, ventral hernia repair;  Surgeon: MDonnie Mesa MD;  [facility]  Service: General;  Laterality: N/A;   SPLENECTOMY     VENTRAL HERNIA REPAIR  06/10/2014    Family History  Problem Relation Age of Onset   Mental illness Mother    Alzheimer's disease Mother    Heart disease Father    Hypertension Father     Social History   Tobacco Use   Smoking status: Every Day    Types: Cigarettes    Last attempt to quit: 04/30/2014    Years since quitting: 6.9    Smokeless tobacco: Current   Tobacco comments:    Still vapes  Substance Use Topics   Alcohol use: No    Alcohol/week: 0.0 standard drinks   Drug use: No     Home Medications Prior to Admission medications   Medication Sig Start Date End Date Taking? Authorizing Provider  ALPRAZolam (Duanne Moron 1 MG tablet Take 1 tablet (1 mg total) by mouth at bedtime as needed and may repeat dose one time if needed for sleep. 12/17/18   SForrest Moron MD  amLODipine (NORVASC) 5 MG tablet Take 1 tablet (5 mg total) by mouth daily. 07/02/18   SShawnee Knapp MD  aspirin EC 81 MG EC tablet Take 1 tablet (81 mg total) by mouth daily. 05/08/15   BTheora Gianotti NP  Co-Enzyme Q-10 100 MG CAPS Take by mouth.    [provider]  doxepin (SINEQUAN) 25 MG capsule Take 1 capsule (25 mg total) by mouth at bedtime. 12/17/18   SForrest Moron MD  magnesium 30 MG tablet Take 30 mg by mouth 2 (two) times daily.    [provider]  Multiple Vitamins-Minerals (MULTIVITAMIN WITH MINERALS) tablet Take 1 tablet by mouth daily.    [provider]     Allergies  Penicillins and Sulfa antibiotics   Review of Systems   Review of Systems A comprehensive review of systems was completed and negative except as noted in HPI.    Physical Exam BP 126/67   Pulse 63   Temp 98.5 F (36.9 C) (Oral)   Resp 18   Ht '6\' 3"'$  (1.905 m)   Wt 71.7 kg   SpO2 100%   BMI 19.75 kg/m   Physical Exam Vitals and nursing note reviewed.  Constitutional:      Appearance: Normal appearance.  HENT:     Head: Normocephalic and atraumatic.     Nose: Nose normal.     Mouth/Throat:     Mouth: Mucous membranes are moist.  Eyes:     Extraocular Movements: Extraocular movements intact.     Conjunctiva/sclera: Conjunctivae normal.  Cardiovascular:     Rate and Rhythm: Normal rate.  Pulmonary:     Effort: Pulmonary effort is normal.     Breath sounds: Normal breath sounds.  Abdominal:     General:  Abdomen is flat.     Palpations: Abdomen is soft.     Tenderness: There is generalized abdominal tenderness.     Hernia: There is no hernia in the ventral area, left inguinal area or right inguinal area.  Musculoskeletal:        General: No swelling. Normal range of motion.     Cervical back: Neck supple.  Skin:    General: Skin is warm and dry.  Neurological:     General: No focal deficit present.     Mental Status: He is alert.  Psychiatric:        Mood and Affect: Mood normal.     ED Results / Procedures / Treatments   Labs (all labs ordered are listed, but only abnormal results are displayed) Labs Reviewed  COMPREHENSIVE METABOLIC PANEL - Abnormal; Notable for the following components:      Result Value   Sodium 127 (*)    Chloride 95 (*)    Glucose, Bld 116 (*)    All other components within normal limits  CBC WITH DIFFERENTIAL/PLATELET - Abnormal; Notable for the following components:   WBC 15.9 (*)    Platelets 406 (*)    Neutro Abs 13.8 (*)    All other components within normal limits  LIPASE, BLOOD    EKG None  Radiology CT Abdomen Pelvis W Contrast  Result Date: 04/08/2021 CLINICAL DATA:  Upper abdominal and right lower quadrant pain related to hernia. EXAM: CT ABDOMEN AND PELVIS WITH CONTRAST TECHNIQUE: Multidetector CT imaging of the abdomen and pelvis was performed using the standard protocol following bolus administration of intravenous contrast. CONTRAST:  66m OMNIPAQUE IOHEXOL 350 MG/ML SOLN COMPARISON:  CT chest 01/21/2020, CT abdomen pelvis 06/10/2014, ultrasound abdomen 12/16/2019, ultrasound abdomen 09/25/2009. FINDINGS: Lower chest: No acute abnormality. Hepatobiliary: Heterogeneous 1.4 cm right hepatic lobe lesion (2:21, 5:63) with query peripheral discontinuous nodular like enhancement suggestive of a hepatic hemangioma. Redemonstration of a subcentimeter left hepatic lobe lesion that is too small to characterize. Status post cholecystectomy. No  biliary dilatation. Pancreas: No focal lesion. Normal pancreatic contour. No surrounding inflammatory changes. No main pancreatic ductal dilatation. Spleen: Status post splenectomy with question of similar-appearing splenosis in the left upper quadrant (2:17). Adrenals/Urinary Tract: No adrenal nodule bilaterally. Bilateral kidneys enhance symmetrically. Bilateral fluid density lesions within the kidneys likely represent simple renal cysts measuring up to 2.5 cm on the right and 2.1 cm on the left. Subcentimeter hypodensities are  too small to characterize. No hydronephrosis. No hydroureter. The urinary bladder is unremarkable. Stomach/Bowel: Stomach is within normal limits. No evidence of bowel wall thickening or dilatation. Scattered diverticulosis. Under distended rectosigmoid colon. Appendix appears normal. Vascular/Lymphatic: No abdominal aorta or iliac aneurysm. Mild atherosclerotic plaque of the aorta and its branches. No abdominal, pelvic, or inguinal lymphadenopathy. Reproductive: Prostate is unremarkable. Other: No intraperitoneal free fluid. No intraperitoneal free gas. No organized fluid collection. Musculoskeletal: Status post ventral wall hernia repair with no recurrent hernia identified. No suspicious lytic or blastic osseous lesions. No acute displaced fracture. Multilevel degenerative changes of the spine. IMPRESSION: 1. No acute intra-abdominal or intrapelvic abnormality in a patient status post ventral hernia repair with no recurrent hernia or associated bowel obstruction. 2. A 1.4 cm right hepatic lobe lesion likely representing a hepatic hemangioma previously identified on ultrasound abdomen 09/25/2009. 3. Scattered diverticulosis with no acute diverticulitis. 4. 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 5. No nephroureterolithiasis. 6. Normal appendix. Electronically Signed   By: Iven Finn M.D.   On: 04/08/2021 18:45     Procedures Procedures  Medications Ordered in the ED Medications  iohexol (OMNIPAQUE) 350 MG/ML injection 85 mL (85 mLs Intravenous Contrast Given 04/08/21 1804)     MDM Rules/Calculators/A&P MDM Patient here with suspected inguinal hernia, no longer bulging and not palpable on exam. He is still having diffuse pain however, despite opiates given by EMS. Will check labs, send for CT to further evaluate hernia and to look for other causes of his continued pain.   ED Course  I have reviewed the triage vital signs and the nursing notes.  Pertinent labs & imaging results that were available during my care of the patient were reviewed by me and considered in my medical decision making (see chart for details).  Clinical Course as of 04/08/21 1928  Fri Apr 08, 2021  1705 CBC with mild leukocytosis.  [CS]  [id number] CMP/Liapse with moderate hyponatremia, otherwise normal.  [CS]  1926 Patient has been resting comfortable. Informed of CT results, no signs of inguinal hernia. Advised PCP follow up for further evaluation of his pain. [CS]    Clinical Course User Index [CS] Truddie Hidden, MD    Final Clinical Impression(s) / ED Diagnoses Final diagnoses:  Right lower quadrant abdominal pain    Rx / DC Orders ED Discharge Orders     None        Truddie Hidden, MD 04/08/21 Kathyrn Drown

## 2021-04-08 NOTE — ED Triage Notes (Signed)
Patient complaining of RLQ pain related to hernia. States that it was bulging earlier and has since improved in size and pain. States that he had similar pain a few weeks ago with hx of hernia. Patient has been medicated with Morphine and Fentanyl PTA. Complaining of nausea and constipation.

## 2021-04-08 NOTE — ED Notes (Signed)
Patient to CT.

## 2021-07-25 ENCOUNTER — Ambulatory Visit: Payer: Self-pay | Admitting: Surgery

## 2021-07-25 NOTE — H&P (Signed)
Subjective    Chief Complaint: New Consultation (Inguinal hernia)       History of Present Illness: Tanner Webb. is a 70 y.o. male who is seen today as an office consultation at the request of Dr. Brigitte Pulse for evaluation of New Consultation (Inguinal hernia) .   This is a 70 year old male who is status post emergent repair of an incarcerated ventral hernia in October 2015.He was last seen in November 2015.  He presented to the emergency department in August complaining of some right groin pain.  He was examined with physical examination as well as a CT scan.  There is no sign of inguinal hernia or any issues with his ventral hernia at that time.   However, the patient continues to have swelling and tenderness in the right groin.  He denies any obstructive symptoms.  The patient continues to smoke cigarettes and does have a bit of a chronic cough.  He is quite concerned about this pain so he presents now for surgical evaluation.     Review of Systems: A complete review of systems was obtained from the patient.  I have reviewed this information and discussed as appropriate with the patient.  See HPI as well for other ROS.   Review of Systems  Constitutional: Positive for weight loss.  HENT: Positive for hearing loss.   Eyes: Negative.   Respiratory: Positive for cough.   Cardiovascular: Negative.   Gastrointestinal: Positive for abdominal pain, constipation, diarrhea and nausea.  Genitourinary: Negative.   Musculoskeletal: Negative.   Skin: Negative.   Neurological: Negative.   Endo/Heme/Allergies: Bruises/bleeds easily.  Psychiatric/Behavioral: Positive for depression. The patient is nervous/anxious.         Medical History: Past Medical History      Past Medical History:  Diagnosis Date   Anxiety     Liver disease             Patient Active Problem List  Diagnosis   Anxiety   Erectile dysfunction due to arterial insufficiency   Essential hypertension    Hyperlipidemia      Past Surgical History       Past Surgical History:  Procedure Laterality Date   CHOLECYSTECTOMY       HERNIA REPAIR       Spleenectomy            Allergies      Allergies  Allergen Reactions   Penicillins Unknown   Sulfa (Sulfonamide Antibiotics) Unknown              Current Outpatient Medications on File Prior to Visit  Medication Sig Dispense Refill   ALPRAZolam (XANAX) 1 MG tablet Take 1 tablet (1 mg total) by mouth 3 (three) times daily as needed       amLODIPine (NORVASC) 10 MG tablet Take 1 tablet (10 mg total) by mouth once daily       doxepin (SINEQUAN) 50 MG capsule Take 1 capsule (50 mg total) by mouth at bedtime       polyethylene glycol (MIRALAX) powder USE ONE CAPFUL BY MOUTH EVERY DAY MIXED INTO EIGHT OUNCE OF ANY CLEAR LIQUID        No current facility-administered medications on file prior to visit.      Family History       Family History  Problem Relation Age of Onset   High blood pressure (Hypertension) Father          Social History  Tobacco Use  Smoking Status Every Day   Packs/day: 0.50   Types: Cigarettes  Smokeless Tobacco Never      Social History  Social History         Socioeconomic History   Marital status: Single  Tobacco Use   Smoking status: Every Day      Packs/day: 0.50      Types: Cigarettes   Smokeless tobacco: Never  Vaping Use   Vaping Use: Never used  Substance and Sexual Activity   Alcohol use: Not Currently   Drug use: Never        Objective:         Vitals:    07/25/21 0933  BP: 130/82  Temp: 36.7 C (98 F)  Weight: 77 kg (169 lb 12.8 oz)  Height: 191.8 cm (6' 3.5")    Body mass index is 20.94 kg/m.   Physical Exam    Constitutional:  WDWN in NAD, conversant, no obvious deformities; lying in bed comfortably Eyes:  Pupils equal, round; sclera anicteric; moist conjunctiva; no lid lag HENT:  Oral mucosa moist; good dentition  Neck:  No masses palpated, trachea  midline; no thyromegaly Lungs:  CTA bilaterally; normal respiratory effort CV:  Regular rate and rhythm; no murmurs; extremities well-perfused with no edema Abd:  +bowel sounds, soft, non-tender, no palpable organomegaly; no palpable hernias; healed midline incision GU: Bilateral descended testes, no testicular masses, palpable right inguinal hernia.  There is tenderness at the external inguinal ring.  No sign of left inguinal hernia Musc:  Unable to assess gait; no apparent clubbing or cyanosis in extremities Lymphatic:  No palpable cervical or axillary lymphadenopathy Skin:  Warm, dry; no sign of jaundice Psychiatric - alert and oriented x 4; very anxious       Assessment and Plan:  Diagnoses and all orders for this visit:   Right inguinal hernia       The hernia is reducible   Recommend right inguinal hernia repair with mesh.The surgical procedure has been discussed with the patient.  Potential risks, benefits, alternative treatments, and expected outcomes have been explained.  All of the patient's questions at this time have been answered.  The likelihood of reaching the patient's treatment goal is good.  The patient understand the proposed surgical procedure and wishes to proceed.   I counseled the patient again about smoking cessation.   No follow-ups on file.   Carlean Jews, MD  07/25/2021 12:06 PM

## 2022-01-19 ENCOUNTER — Ambulatory Visit: Payer: Medicare Other | Admitting: Family Medicine

## 2022-02-02 ENCOUNTER — Ambulatory Visit: Payer: Medicare Other | Admitting: Family Medicine

## 2022-02-23 ENCOUNTER — Telehealth: Payer: Self-pay | Admitting: Family Medicine

## 2022-02-23 NOTE — Telephone Encounter (Signed)
Patient called requesting appointment to begin services here. States previous provider had closed and had not re-opened as planned this summer. Running out of meds (xanax) which he has been taking for 6 years. Advised him of the referral/scheduling process and he will obtain referral 02/28/2022 at appt. with PCP. Given information for [facility] access in case he is unable to obtain refills prior to appointment with [facility].

## 2022-03-01 ENCOUNTER — Ambulatory Visit: Payer: Medicare Other | Admitting: Family Medicine

## 2022-04-07 ENCOUNTER — Ambulatory Visit: Payer: Medicare Other | Admitting: Family Medicine

## 2022-06-15 ENCOUNTER — Ambulatory Visit: Payer: Medicare Other | Admitting: Family Medicine

## 2022-07-28 ENCOUNTER — Ambulatory Visit: Payer: Medicare Other | Admitting: Family Medicine

## 2022-09-08 ENCOUNTER — Ambulatory Visit: Payer: Medicare Other | Admitting: Family Medicine

## 2023-06-19 ENCOUNTER — Ambulatory Visit (INDEPENDENT_AMBULATORY_CARE_PROVIDER_SITE_OTHER): Payer: Medicare Other

## 2024-01-08 ENCOUNTER — Encounter: Payer: Self-pay | Admitting: *Deleted
